# Patient Record
Sex: Female | Born: 1980 | Race: Black or African American | Hispanic: No | Marital: Single | Smoking: Never smoker
Health system: Southern US, Community
[De-identification: ages and names within clinical notes are randomized; demographics above are authoritative.]

## PROBLEM LIST (undated history)

## (undated) DIAGNOSIS — G43909 Migraine, unspecified, not intractable, without status migrainosus: Secondary | ICD-10-CM

## (undated) DIAGNOSIS — J939 Pneumothorax, unspecified: Secondary | ICD-10-CM

## (undated) DIAGNOSIS — N83201 Unspecified ovarian cyst, right side: Secondary | ICD-10-CM

## (undated) HISTORY — PX: OVARIAN CYST SURGERY: SHX726

---

## 2009-04-15 ENCOUNTER — Ambulatory Visit: Payer: Self-pay | Admitting: Diagnostic Radiology

## 2009-04-15 ENCOUNTER — Emergency Department (HOSPITAL_BASED_OUTPATIENT_CLINIC_OR_DEPARTMENT_OTHER): Admission: EM | Admit: 2009-04-15 | Discharge: 2009-04-15 | Payer: Self-pay | Admitting: Emergency Medicine

## 2009-11-11 ENCOUNTER — Ambulatory Visit: Payer: Self-pay | Admitting: Diagnostic Radiology

## 2009-11-11 ENCOUNTER — Emergency Department (HOSPITAL_BASED_OUTPATIENT_CLINIC_OR_DEPARTMENT_OTHER): Admission: EM | Admit: 2009-11-11 | Discharge: 2009-11-11 | Payer: Self-pay | Admitting: Emergency Medicine

## 2010-04-08 ENCOUNTER — Emergency Department (HOSPITAL_COMMUNITY)
Admission: EM | Admit: 2010-04-08 | Discharge: 2010-04-09 | Payer: Self-pay | Source: Home / Self Care | Admitting: Emergency Medicine

## 2010-04-09 ENCOUNTER — Inpatient Hospital Stay (HOSPITAL_COMMUNITY)
Admission: AD | Admit: 2010-04-09 | Discharge: 2010-04-09 | Payer: Self-pay | Attending: Obstetrics and Gynecology | Admitting: Obstetrics and Gynecology

## 2010-04-13 LAB — CBC
HCT: 35.7 % — ABNORMAL LOW (ref 36.0–46.0)
Hemoglobin: 11.9 g/dL — ABNORMAL LOW (ref 12.0–15.0)
MCH: 28 pg (ref 26.0–34.0)
MCH: 28.1 pg (ref 26.0–34.0)
MCHC: 32.6 g/dL (ref 30.0–36.0)
MCHC: 33.3 g/dL (ref 30.0–36.0)
Platelets: 290 10*3/uL (ref 150–400)
RBC: 4.36 MIL/uL (ref 3.87–5.11)
WBC: 12 10*3/uL — ABNORMAL HIGH (ref 4.0–10.5)

## 2010-04-13 LAB — URINE MICROSCOPIC-ADD ON

## 2010-04-13 LAB — COMPREHENSIVE METABOLIC PANEL
ALT: 8 U/L (ref 0–35)
Alkaline Phosphatase: 51 U/L (ref 39–117)
CO2: 23 mEq/L (ref 19–32)
GFR calc Af Amer: >60 mL/min (ref >60)
GFR calc non Af Amer: >60 mL/min (ref >60)
Glucose, Bld: 86 mg/dL (ref 70–99)
Potassium: 4.3 mEq/L (ref 3.5–5.1)
Sodium: 137 mEq/L (ref 135–145)
Total Protein: 7.4 g/dL (ref 6.0–8.3)

## 2010-04-13 LAB — DIFFERENTIAL
Basophils Absolute: 0 10*3/uL (ref 0.0–0.1)
Basophils Absolute: 0 10*3/uL (ref 0.0–0.1)
Basophils Relative: 0 % (ref 0–1)
Eosinophils Relative: 1 % (ref 0–5)
Lymphocytes Relative: 13 % (ref 12–46)
Monocytes Absolute: 1.2 10*3/uL — ABNORMAL HIGH (ref 0.1–1.0)
Neutro Abs: 12.1 10*3/uL — ABNORMAL HIGH (ref 1.7–7.7)
Neutro Abs: 9.4 10*3/uL — ABNORMAL HIGH (ref 1.7–7.7)
Neutrophils Relative %: 83 % — ABNORMAL HIGH (ref 43–77)
Neutrophils Relative %: 78 % — ABNORMAL HIGH (ref 43–77)

## 2010-04-13 LAB — URINALYSIS, ROUTINE W REFLEX MICROSCOPIC
Bilirubin Urine: NEGATIVE
Protein, ur: NEGATIVE mg/dL
Urobilinogen, UA: 0.2 mg/dL (ref 0.0–1.0)

## 2010-04-13 LAB — WET PREP, GENITAL
Trich, Wet Prep: NONE SEEN
Yeast Wet Prep HPF POC: NONE SEEN

## 2010-04-13 LAB — GC/CHLAMYDIA PROBE AMP, GENITAL: Chlamydia, DNA Probe: NEGATIVE

## 2010-04-13 LAB — POCT PREGNANCY, URINE: Preg Test, Ur: NEGATIVE

## 2011-04-07 ENCOUNTER — Inpatient Hospital Stay (HOSPITAL_COMMUNITY)
Admission: AD | Admit: 2011-04-07 | Discharge: 2011-04-07 | Disposition: A | Discharge status: Home / Self Care | Payer: Self-pay | Source: Ambulatory Visit | Attending: Obstetrics & Gynecology | Admitting: Obstetrics & Gynecology

## 2011-04-07 ENCOUNTER — Inpatient Hospital Stay (HOSPITAL_COMMUNITY): Payer: Self-pay

## 2011-04-07 ENCOUNTER — Encounter (HOSPITAL_COMMUNITY): Payer: Self-pay

## 2011-04-07 DIAGNOSIS — A499 Bacterial infection, unspecified: Secondary | ICD-10-CM | POA: Insufficient documentation

## 2011-04-07 DIAGNOSIS — N83209 Unspecified ovarian cyst, unspecified side: Secondary | ICD-10-CM | POA: Insufficient documentation

## 2011-04-07 DIAGNOSIS — N760 Acute vaginitis: Secondary | ICD-10-CM | POA: Insufficient documentation

## 2011-04-07 DIAGNOSIS — N83201 Unspecified ovarian cyst, right side: Secondary | ICD-10-CM

## 2011-04-07 DIAGNOSIS — R109 Unspecified abdominal pain: Secondary | ICD-10-CM | POA: Insufficient documentation

## 2011-04-07 DIAGNOSIS — B9689 Other specified bacterial agents as the cause of diseases classified elsewhere: Secondary | ICD-10-CM | POA: Insufficient documentation

## 2011-04-07 HISTORY — DX: Migraine, unspecified, not intractable, without status migrainosus: G43.909

## 2011-04-07 HISTORY — DX: Unspecified ovarian cyst, right side: N83.201

## 2011-04-07 LAB — URINALYSIS, ROUTINE W REFLEX MICROSCOPIC
Glucose, UA: NEGATIVE mg/dL
Specific Gravity, Urine: 1.02 (ref 1.005–1.030)
pH: 7 (ref 5.0–8.0)

## 2011-04-07 LAB — CBC
HCT: 34.5 % — ABNORMAL LOW (ref 36.0–46.0)
Hemoglobin: 11.4 g/dL — ABNORMAL LOW (ref 12.0–15.0)
MCV: 86 fL (ref 78.0–100.0)
RBC: 4.01 MIL/uL (ref 3.87–5.11)
RDW: 13.1 % (ref 11.5–15.5)
WBC: 9.4 10*3/uL (ref 4.0–10.5)

## 2011-04-07 LAB — URINE MICROSCOPIC-ADD ON

## 2011-04-07 MED ORDER — NAPROXEN SODIUM 550 MG PO TABS: 550.0000 mg | ORAL_TABLET | Freq: Two times a day (BID) | ORAL | Status: DC

## 2011-04-07 MED ORDER — HYDROMORPHONE HCL PF 1 MG/ML IJ SOLN
1.0000 mg | Freq: Once | INTRAMUSCULAR | Status: AC
  Administered 2011-04-07: 1 mg via INTRAMUSCULAR
  Filled 2011-04-07: qty 1

## 2011-04-07 MED ORDER — KETOROLAC TROMETHAMINE 30 MG/ML IJ SOLN
30.0000 mg | Freq: Once | INTRAMUSCULAR | Status: AC
  Administered 2011-04-07: 30 mg via INTRAMUSCULAR

## 2011-04-07 MED ORDER — KETOROLAC TROMETHAMINE 30 MG/ML IJ SOLN
30.0000 mg | Freq: Once | INTRAMUSCULAR | Status: DC
  Filled 2011-04-07: qty 1

## 2011-04-07 MED ORDER — METRONIDAZOLE 500 MG PO TABS: 500.0000 mg | ORAL_TABLET | Freq: Two times a day (BID) | ORAL | Status: AC

## 2011-04-07 NOTE — Progress Notes (Signed)
Patient states she started having right lower abdominal pain that radiates to the left side and up to mid abdomen about one week ago. Periods of nausea with the pain but no vomiting. Patient has a history of ovarian cyst with surgical removal x 2. No bleeding or vaginal discharge.

## 2011-04-07 NOTE — ED Provider Notes (Signed)
History     Chief Complaint  Patient presents with  . Abdominal Pain   HPI Tanya Holland 31 y.o. LMP 03-29-11.  Having lower abdominal pain since Friday.  Same as previously when she had an ovarian cyst.  Has not taken any pain medication at home for this pain.  Has not seen her doctor since June 2012.  Has GYN in [facility] but wants to change to [facility] when her insurance is active.  History of previous surgery for ovarian cyst.   OB History    Grav Para Term Preterm Abortions TAB SAB Ect Mult Living   0 0 0 0 0 0 0 0 0 0       Past Medical History  Diagnosis Date  . Asthma   . Migraines   . Ovarian cyst, right     2 surgeries    Past Surgical History  Procedure Date  . Ovarian cyst surgery 2006 & 2009    Family History  Problem Relation Age of Onset  . Anesthesia problems Neg Hx   . Hypotension Neg Hx   . Malignant hyperthermia Neg Hx   . Pseudochol deficiency Neg Hx     History  Substance Use Topics  . Smoking status: Never Smoker   . Smokeless tobacco: Not on file  . Alcohol Use: Yes    Allergies:  Allergies  Allergen Reactions  . Chocolate Hives, Itching and Swelling    Tongue swells  . Morphine And Related Rash    Hallucinations    Prescriptions prior to admission  Medication Sig Dispense Refill  . albuterol (PROVENTIL HFA;VENTOLIN HFA) 108 (90 BASE) MCG/ACT inhaler Inhale 2 puffs into the lungs every 6 (six) hours as needed. For athsma        Review of Systems  Gastrointestinal: Positive for abdominal pain. Negative for nausea and vomiting.   Physical Exam   Blood pressure 132/82, pulse 69, temperature 98.7 F (37.1 C), temperature source Oral, resp. rate 16, height 4\' 10"  (1.473 m), weight 116 lb (52.617 kg), last menstrual period 03/29/2011, SpO2 99.00%.  Physical Exam  Nursing note and vitals reviewed. Constitutional: She is oriented to person, place, and time. She appears well-developed and well-nourished.  HENT:  Head:  Normocephalic.  Eyes: EOM are normal.  Neck: Neck supple.  GI: Soft. There is tenderness. There is no rebound and no guarding.  Genitourinary:       Speculum exam: Vulva:  White liquid discharge dripping down perineum Vagina - Mod amount of white liquid discharge, no odor Cervix - No contact bleeding Bimanual exam: Cervix closed Uterus exam limited due to pain Adnexa exam limited due to pain, GC/Chlam, wet prep done Chaperone present for exam.  Musculoskeletal: Normal range of motion.  Neurological: She is alert and oriented to person, place, and time.  Skin: Skin is warm and dry.  Psychiatric: She has a normal mood and affect.    MAU Course  Procedures  Clinical Data: Pelvic pain. History of ovarian cysts.  TRANSABDOMINAL AND TRANSVAGINAL ULTRASOUND OF PELVIS  Technique: Both transabdominal and transvaginal ultrasound  examinations of the pelvis were performed. Transabdominal technique  was performed for global imaging of the pelvis including uterus,  ovaries, adnexal regions, and pelvic cul-de-sac.  Comparison: CT of the abdomen and pelvis performed 04/08/2010, and  pelvic ultrasound performed 04/09/2010  It was necessary to proceed with endovaginal exam following the  transabdominal exam to visualize the uterus and ovaries in greater  detail.  Findings:  Uterus: Normal  in size and appearance; measures 6.8 x 2.6 x 3.1 cm.  A few Nabothian cysts are noted at the cervix.  Endometrium: Normal in thickness and appearance; measures 0.4 cm in  thickness.  Right ovary: Measures 4.3 x 2.9 x 3.2 cm. There is a stable  appearance to a 2.9 cm cyst at the right ovary. This may contain a  small amount of debris or a small 1.1 cm soft tissue nodule  posteriorly, new from the prior study. There is normal color  Doppler blood flow to the right ovary; no definite blood flow is  characterized within the apparent soft tissue nodule.  Left ovary: Normal appearance/no adnexal mass;  measures 3.2 x 1.9 x  2.6 cm in size.  Other findings: Trace free fluid seen within the pelvic cul-de-sac.  IMPRESSION:  1. No evidence of ovarian torsion.  2. Stable appearance to 2.9 cm cyst at the right ovary. This may  contain a small amount of debris or a small 1.1 cm soft tissue  nodule posteriorly, apparently new from the prior study. Recommend  follow-up pelvic ultrasound in 2-3 months to ensure stability.    MDM Results for orders placed during the hospital encounter of 04/07/11 (from the past 24 hour(s))  URINALYSIS, ROUTINE W REFLEX MICROSCOPIC     Status: Abnormal   Collection Time   04/07/11  7:05 PM      Component Value Range   Color, Urine YELLOW  YELLOW    APPearance HAZY (*) CLEAR    Specific Gravity, Urine 1.020  1.005 - 1.030    pH 7.0  5.0 - 8.0    Glucose, UA NEGATIVE  NEGATIVE (mg/dL)   Hgb urine dipstick TRACE (*) NEGATIVE    Bilirubin Urine NEGATIVE  NEGATIVE    Ketones, ur NEGATIVE  NEGATIVE (mg/dL)   Protein, ur NEGATIVE  NEGATIVE (mg/dL)   Urobilinogen, UA 0.2  0.0 - 1.0 (mg/dL)   Nitrite NEGATIVE  NEGATIVE    Leukocytes, UA SMALL (*) NEGATIVE   URINE MICROSCOPIC-ADD ON     Status: Abnormal   Collection Time   04/07/11  7:05 PM      Component Value Range   Squamous Epithelial / LPF MANY (*) RARE    WBC, UA 3-6  <3 (WBC/hpf)   Bacteria, UA FEW (*) RARE    Urine-Other MUCOUS PRESENT    POCT PREGNANCY, URINE     Status: Normal   Collection Time   04/07/11  7:15 PM      Component Value Range   Preg Test, Ur NEGATIVE    WET PREP, GENITAL     Status: Abnormal   Collection Time   04/07/11  7:56 PM      Component Value Range   Yeast, Wet Prep NONE SEEN  NONE SEEN    Trich, Wet Prep NONE SEEN  NONE SEEN    Clue Cells, Wet Prep MODERATE (*) NONE SEEN    WBC, Wet Prep HPF POC MANY (*) NONE SEEN   CBC     Status: Abnormal   Collection Time   04/07/11  8:07 PM      Component Value Range   WBC 9.4  4.0 - 10.5 (K/uL)   RBC 4.01  3.87 - 5.11 (MIL/uL)    Hemoglobin 11.4 (*) 12.0 - 15.0 (g/dL)   HCT 16.1 (*) 09.6 - 46.0 (%)   MCV 86.0  78.0 - 100.0 (fL)   MCH 28.4  26.0 - 34.0 (pg)   MCHC 33.0  30.0 - 36.0 (g/dL)   RDW 62.9  52.8 - 41.3 (%)   Platelets 290  150 - 400 (K/uL)   Toradol 30 mg IM for pain but no relief obtained.  Will given dilaudid 1 mg IM (client states she has received it before with an ovarian cyst).  Assessment and Plan  Small right ovarian cyst Bacterial vaginosis  Plan rx ultram for pain rx metronidazole 500 mg PO bid x 7 days for BV Will send message to GYN clinic to be seen for follow up    [facility] 04/07/2011, 8:02 PM   Nolene Bernheim, NP 04/07/11 2126  Nolene Bernheim, NP 04/07/11 2134

## 2011-04-08 LAB — GC/CHLAMYDIA PROBE AMP, GENITAL: GC Probe Amp, Genital: NEGATIVE

## 2011-05-03 ENCOUNTER — Encounter: Payer: Self-pay | Admitting: Advanced Practice Midwife

## 2011-05-16 ENCOUNTER — Emergency Department (HOSPITAL_BASED_OUTPATIENT_CLINIC_OR_DEPARTMENT_OTHER)
Admission: EM | Admit: 2011-05-16 | Discharge: 2011-05-16 | Disposition: A | Discharge status: Home / Self Care | Payer: BC Managed Care – PPO | Attending: Emergency Medicine | Admitting: Emergency Medicine

## 2011-05-16 ENCOUNTER — Encounter (HOSPITAL_BASED_OUTPATIENT_CLINIC_OR_DEPARTMENT_OTHER): Payer: Self-pay | Admitting: Emergency Medicine

## 2011-05-16 DIAGNOSIS — R55 Syncope and collapse: Secondary | ICD-10-CM | POA: Insufficient documentation

## 2011-05-16 DIAGNOSIS — N390 Urinary tract infection, site not specified: Secondary | ICD-10-CM

## 2011-05-16 DIAGNOSIS — R112 Nausea with vomiting, unspecified: Secondary | ICD-10-CM | POA: Insufficient documentation

## 2011-05-16 DIAGNOSIS — J45909 Unspecified asthma, uncomplicated: Secondary | ICD-10-CM | POA: Insufficient documentation

## 2011-05-16 DIAGNOSIS — R51 Headache: Secondary | ICD-10-CM

## 2011-05-16 LAB — URINE MICROSCOPIC-ADD ON

## 2011-05-16 LAB — URINALYSIS, ROUTINE W REFLEX MICROSCOPIC
Bilirubin Urine: NEGATIVE
Hgb urine dipstick: NEGATIVE
Specific Gravity, Urine: 1.014 (ref 1.005–1.030)
pH: 6.5 (ref 5.0–8.0)

## 2011-05-16 MED ORDER — DIPHENHYDRAMINE HCL 50 MG/ML IJ SOLN
25.0000 mg | Freq: Once | INTRAMUSCULAR | Status: DC
  Filled 2011-05-16: qty 1

## 2011-05-16 MED ORDER — METOCLOPRAMIDE HCL 5 MG/ML IJ SOLN
10.0000 mg | Freq: Once | INTRAMUSCULAR | Status: AC
  Administered 2011-05-16: 10 mg via INTRAVENOUS

## 2011-05-16 MED ORDER — METOCLOPRAMIDE HCL 5 MG/ML IJ SOLN
10.0000 mg | Freq: Once | INTRAMUSCULAR | Status: DC
  Filled 2011-05-16: qty 2

## 2011-05-16 MED ORDER — DIPHENHYDRAMINE HCL 50 MG/ML IJ SOLN
25.0000 mg | Freq: Once | INTRAMUSCULAR | Status: AC
  Administered 2011-05-16: 25 mg via INTRAVENOUS

## 2011-05-16 MED ORDER — KETOROLAC TROMETHAMINE 30 MG/ML IJ SOLN
30.0000 mg | Freq: Once | INTRAMUSCULAR | Status: AC
  Administered 2011-05-16: 30 mg via INTRAVENOUS

## 2011-05-16 MED ORDER — SODIUM CHLORIDE 0.9 % IV BOLUS (SEPSIS)
1000.0000 mL | Freq: Once | INTRAVENOUS | Status: AC
  Administered 2011-05-16: 1000 mL via INTRAVENOUS

## 2011-05-16 MED ORDER — KETOROLAC TROMETHAMINE 30 MG/ML IJ SOLN
60.0000 mg | Freq: Once | INTRAMUSCULAR | Status: DC
  Filled 2011-05-16: qty 2

## 2011-05-16 MED ORDER — SULFAMETHOXAZOLE-TRIMETHOPRIM 800-160 MG PO TABS: 1.0000 | ORAL_TABLET | Freq: Two times a day (BID) | ORAL | Status: AC

## 2011-05-16 NOTE — Discharge Instructions (Signed)
Headaches, Frequently Asked Questions MIGRAINE HEADACHES Q: What is migraine? What causes it? How can I treat it? A: Generally, migraine headaches begin as a dull ache. Then they develop into a constant, throbbing, and pulsating pain. You may experience pain at the temples. You may experience pain at the front or back of one or both sides of the head. The pain is usually accompanied by a combination of:  Nausea.   Vomiting.   Sensitivity to light and noise.  Some people (about 15%) experience an aura (see below) before an attack. The cause of migraine is believed to be chemical reactions in the brain. Treatment for migraine may include over-the-counter or prescription medications. It may also include self-help techniques. These include relaxation training and biofeedback.  Q: What is an aura? A: About 15% of people with migraine get an "aura". This is a sign of neurological symptoms that occur before a migraine headache. You may see wavy or jagged lines, dots, or flashing lights. You might experience tunnel vision or blind spots in one or both eyes. The aura can include visual or auditory hallucinations (something imagined). It may include disruptions in smell (such as strange odors), taste or touch. Other symptoms include:  Numbness.   A "pins and needles" sensation.   Difficulty in recalling or speaking the correct word.  These neurological events may last as long as 60 minutes. These symptoms will fade as the headache begins. Q: What is a trigger? A: Certain physical or environmental factors can lead to or "trigger" a migraine. These include:  Foods.   Hormonal changes.   Weather.   Stress.  It is important to remember that triggers are different for everyone. To help prevent migraine attacks, you need to figure out which triggers affect you. Keep a headache diary. This is a good way to track triggers. The diary will help you talk to your healthcare professional about your  condition. Q: Does weather affect migraines? A: Bright sunshine, hot, humid conditions, and drastic changes in barometric pressure may lead to, or "trigger," a migraine attack in some people. But studies have shown that weather does not act as a trigger for everyone with migraines. Q: What is the link between migraine and hormones? A: Hormones start and regulate many of your body's functions. Hormones keep your body in balance within a constantly changing environment. The levels of hormones in your body are unbalanced at times. Examples are during menstruation, pregnancy, or menopause. That can lead to a migraine attack. In fact, about three quarters of all women with migraine report that their attacks are related to the menstrual cycle.  Q: Is there an increased risk of stroke for migraine sufferers? A: The likelihood of a migraine attack causing a stroke is very remote. That is not to say that migraine sufferers cannot have a stroke associated with their migraines. In persons under age 40, the most common associated factor for stroke is migraine headache. But over the course of a person's normal life span, the occurrence of migraine headache may actually be associated with a reduced risk of dying from cerebrovascular disease due to stroke.  Q: What are acute medications for migraine? A: Acute medications are used to treat the pain of the headache after it has started. Examples over-the-counter medications, NSAIDs, ergots, and triptans.  Q: What are the triptans? A: Triptans are the newest class of abortive medications. They are specifically targeted to treat migraine. Triptans are vasoconstrictors. They moderate some chemical reactions in the brain.   The triptans work on receptors in your brain. Triptans help to restore the balance of a neurotransmitter called serotonin. Fluctuations in levels of serotonin are thought to be a main cause of migraine.  Q: Are over-the-counter medications for migraine  effective? A: Over-the-counter, or "OTC," medications may be effective in relieving mild to moderate pain and associated symptoms of migraine. But you should see your caregiver before beginning any treatment regimen for migraine.  Q: What are preventive medications for migraine? A: Preventive medications for migraine are sometimes referred to as "prophylactic" treatments. They are used to reduce the frequency, severity, and length of migraine attacks. Examples of preventive medications include antiepileptic medications, antidepressants, beta-blockers, calcium channel blockers, and NSAIDs (nonsteroidal anti-inflammatory drugs). Q: Why are anticonvulsants used to treat migraine? A: During the past few years, there has been an increased interest in antiepileptic drugs for the prevention of migraine. They are sometimes referred to as "anticonvulsants". Both epilepsy and migraine may be caused by similar reactions in the brain.  Q: Why are antidepressants used to treat migraine? A: Antidepressants are typically used to treat people with depression. They may reduce migraine frequency by regulating chemical levels, such as serotonin, in the brain.  Q: What alternative therapies are used to treat migraine? A: The term "alternative therapies" is often used to describe treatments considered outside the scope of conventional Western medicine. Examples of alternative therapy include acupuncture, acupressure, and yoga. Another common alternative treatment is herbal therapy. Some herbs are believed to relieve headache pain. Always discuss alternative therapies with your caregiver before proceeding. Some herbal products contain arsenic and other toxins. TENSION HEADACHES Q: What is a tension-type headache? What causes it? How can I treat it? A: Tension-type headaches occur randomly. They are often the result of temporary stress, anxiety, fatigue, or anger. Symptoms include soreness in your temples, a tightening  band-like sensation around your head (a "vice-like" ache). Symptoms can also include a pulling feeling, pressure sensations, and contracting head and neck muscles. The headache begins in your forehead, temples, or the back of your head and neck. Treatment for tension-type headache may include over-the-counter or prescription medications. Treatment may also include self-help techniques such as relaxation training and biofeedback. CLUSTER HEADACHES Q: What is a cluster headache? What causes it? How can I treat it? A: Cluster headache gets its name because the attacks come in groups. The pain arrives with little, if any, warning. It is usually on one side of the head. A tearing or bloodshot eye and a runny nose on the same side of the headache may also accompany the pain. Cluster headaches are believed to be caused by chemical reactions in the brain. They have been described as the most severe and intense of any headache type. Treatment for cluster headache includes prescription medication and oxygen. SINUS HEADACHES Q: What is a sinus headache? What causes it? How can I treat it? A: When a cavity in the bones of the face and skull (a sinus) becomes inflamed, the inflammation will cause localized pain. This condition is usually the result of an allergic reaction, a tumor, or an infection. If your headache is caused by a sinus blockage, such as an infection, you will probably have a fever. An x-ray will confirm a sinus blockage. Your caregiver's treatment might include antibiotics for the infection, as well as antihistamines or decongestants.  REBOUND HEADACHES Q: What is a rebound headache? What causes it? How can I treat it? A: A pattern of taking acute headache medications too   often can lead to a condition known as "rebound headache." A pattern of taking too much headache medication includes taking it more than 2 days per week or in excessive amounts. That means more than the label or a caregiver advises.  With rebound headaches, your medications not only stop relieving pain, they actually begin to cause headaches. Doctors treat rebound headache by tapering the medication that is being overused. Sometimes your caregiver will gradually substitute a different type of treatment or medication. Stopping may be a challenge. Regularly overusing a medication increases the potential for serious side effects. Consult a caregiver if you regularly use headache medications more than 2 days per week or more than the label advises. ADDITIONAL QUESTIONS AND ANSWERS Q: What is biofeedback? A: Biofeedback is a self-help treatment. Biofeedback uses special equipment to monitor your body's involuntary physical responses. Biofeedback monitors:  Breathing.   Pulse.   Heart rate.   Temperature.   Muscle tension.   Brain activity.  Biofeedback helps you refine and perfect your relaxation exercises. You learn to control the physical responses that are related to stress. Once the technique has been mastered, you do not need the equipment any more. Q: Are headaches hereditary? A: Four out of five (80%) of people that suffer report a family history of migraine. Scientists are not sure if this is genetic or a family predisposition. Despite the uncertainty, a child has a 50% chance of having migraine if one parent suffers. The child has a 75% chance if both parents suffer.  Q: Can children get headaches? A: By the time they reach high school, most young people have experienced some type of headache. Many safe and effective approaches or medications can prevent a headache from occurring or stop it after it has begun.  Q: What type of doctor should I see to diagnose and treat my headache? A: Start with your primary caregiver. Discuss his or her experience and approach to headaches. Discuss methods of classification, diagnosis, and treatment. Your caregiver may decide to recommend you to a headache specialist, depending upon  your symptoms or other physical conditions. Having diabetes, allergies, etc., may require a more comprehensive and inclusive approach to your headache. The National Headache Foundation will provide, upon request, a list of [facility] physician members in your state. Document Released: 05/29/2003 Document Revised: 11/18/2010 Document Reviewed: 11/06/2007 [facility] Patient Information 2012 [place], [place].Urinary Tract Infection Infections of the urinary tract can start in several places. A bladder infection (cystitis), a kidney infection (pyelonephritis), and a prostate infection (prostatitis) are different types of urinary tract infections (UTIs). They usually get better if treated with medicines (antibiotics) that kill germs. Take all the medicine until it is gone. You or your child may feel better in a few days, but TAKE ALL MEDICINE or the infection may not respond and may become more difficult to treat. HOME CARE INSTRUCTIONS   Drink enough water and fluids to keep the urine clear or pale yellow. Cranberry juice is especially recommended, in addition to large amounts of water.   Avoid caffeine, tea, and carbonated beverages. They tend to irritate the bladder.   Alcohol may irritate the prostate.   Only take over-the-counter or prescription medicines for pain, discomfort, or fever as directed by your caregiver.  To prevent further infections:  Empty the bladder often. Avoid holding urine for long periods of time.   After a bowel movement, women should cleanse from front to back. Use each tissue only once.   Empty the bladder before  and after sexual intercourse.  FINDING OUT THE RESULTS OF YOUR TEST Not all test results are available during your visit. If your or your child's test results are not back during the visit, make an appointment with your caregiver to find out the results. Do not assume everything is normal if you have not heard from your caregiver or the medical facility. It is important  for you to follow up on all test results. SEEK MEDICAL CARE IF:   There is back pain.   Your baby is older than 3 months with a rectal temperature of 100.5 F (38.1 C) or higher for more than 1 day.   Your or your child's problems (symptoms) are no better in 3 days. Return sooner if you or your child is getting worse.  SEEK IMMEDIATE MEDICAL CARE IF:   There is severe back pain or lower abdominal pain.   You or your child develops chills.   You have a fever.   Your baby is older than 3 months with a rectal temperature of 102 F (38.9 C) or higher.   Your baby is 80 months old or younger with a rectal temperature of 100.4 F (38 C) or higher.   There is nausea or vomiting.   There is continued burning or discomfort with urination.  MAKE SURE YOU:   Understand these instructions.   Will watch your condition.   Will get help right away if you are not doing well or get worse.  Document Released: 12/16/2004 Document Revised: 11/18/2010 Document Reviewed: 07/21/2006 [facility] Patient Information 2012 [place], [place].

## 2011-05-16 NOTE — ED Notes (Signed)
Pt feels better, wants to go home, FNP notified

## 2011-05-16 NOTE — ED Provider Notes (Signed)
History     CSN: [id number]  Arrival date & time 05/16/11  1318   First MD Initiated Contact with Patient 05/16/11 1442      Chief Complaint  Patient presents with  . Headache    (Consider location/radiation/quality/duration/timing/severity/associated sxs/prior treatment) HPI Comments: Pt c/o headache since yesterday which is similar to previous migraines  Patient is a 31 y.o. female presenting with headaches. The history is provided by the patient. No language interpreter was used.  Headache  This is a recurrent problem. The current episode started yesterday. The problem occurs constantly. The headache is associated with bright light. The pain is located in the frontal region. The quality of the pain is described as throbbing. The pain is moderate. The pain does not radiate. Associated symptoms include syncope, nausea and vomiting. Pertinent negatives include no shortness of breath. She has tried NSAIDs and aspirin for the symptoms. The treatment provided no relief.    Past Medical History  Diagnosis Date  . Asthma   . Migraines   . Ovarian cyst, right     2 surgeries    Past Surgical History  Procedure Date  . Ovarian cyst surgery 2006 & 2009    Family History  Problem Relation Age of Onset  . Anesthesia problems Neg Hx   . Hypotension Neg Hx   . Malignant hyperthermia Neg Hx   . Pseudochol deficiency Neg Hx     History  Substance Use Topics  . Smoking status: Never Smoker   . Smokeless tobacco: Not on file  . Alcohol Use: Yes    OB History    Grav Para Term Preterm Abortions TAB SAB Ect Mult Living   0 0 0 0 0 0 0 0 0 0       Review of Systems  Respiratory: Negative for shortness of breath.   Cardiovascular: Positive for syncope.  Gastrointestinal: Positive for nausea and vomiting.  Neurological: Positive for headaches.  All other systems reviewed and are negative.    Allergies  Chocolate and Morphine and related  Home Medications   Current  Outpatient Rx  Name Route Sig Dispense Refill  . ASPIRIN 325 MG PO TABS Oral Take 650 mg by mouth every 6 (six) hours as needed.    . ASPIRIN-ACETAMINOPHEN-CAFFEINE 250-250-65 MG PO TABS Oral Take 2 tablets by mouth every 6 (six) hours as needed.    . ALBUTEROL SULFATE HFA 108 (90 BASE) MCG/ACT IN AERS Inhalation Inhale 2 puffs into the lungs every 6 (six) hours as needed. For athsma    . NAPROXEN SODIUM 550 MG PO TABS Oral Take 1 tablet (550 mg total) by mouth 2 (two) times daily with a meal. 20 tablet 0    BP 118/84  Pulse 71  Temp(Src) 98 F (36.7 C) (Oral)  Resp 16  SpO2 97%  LMP 04/29/2011  Physical Exam  Nursing note and vitals reviewed. Constitutional: She is oriented to person, place, and time. She appears well-developed and well-nourished.  HENT:  Head: Normocephalic and atraumatic.  Right Ear: External ear normal.  Left Ear: External ear normal.  Eyes: Conjunctivae and EOM are normal.  Neck: Neck supple.  Cardiovascular: Normal rate and regular rhythm.   Pulmonary/Chest: Effort normal and breath sounds normal.  Abdominal: Soft. Bowel sounds are normal.  Musculoskeletal: Normal range of motion.  Neurological: She is alert and oriented to person, place, and time.  Skin: Skin is warm and dry.  Psychiatric: She has a normal mood and affect.    ED  Course  Procedures (including critical care time)  Labs Reviewed  URINALYSIS, ROUTINE W REFLEX MICROSCOPIC - Abnormal; Notable for the following:    APPearance CLOUDY (*)    Leukocytes, UA SMALL (*)    All other components within normal limits  URINE MICROSCOPIC-ADD ON - Abnormal; Notable for the following:    Squamous Epithelial / LPF FEW (*)    Bacteria, UA MANY (*)    All other components within normal limits  PREGNANCY, URINE   No results found.   1. UTI (lower urinary tract infection)   2. Headache       MDM  Pt is feeling better at this time, similar to previous headaches:will treat for simple  uti        Teressa Lower, NP 05/16/11 1624  Teressa Lower, NP 05/16/11 1626

## 2011-05-16 NOTE — ED Notes (Signed)
Pt c/o migraine since 7pm yesterday; has taken Excedrin & Bayer asa w/o relief

## 2011-05-17 NOTE — ED Provider Notes (Signed)
Medical screening examination/treatment/procedure(s) were performed by non-physician practitioner and as supervising physician I was immediately available for consultation/collaboration.  Ethelda Chick, MD 05/17/11 1124

## 2011-05-22 ENCOUNTER — Emergency Department (INDEPENDENT_AMBULATORY_CARE_PROVIDER_SITE_OTHER): Payer: BC Managed Care – PPO

## 2011-05-22 ENCOUNTER — Other Ambulatory Visit: Payer: Self-pay

## 2011-05-22 ENCOUNTER — Encounter (HOSPITAL_BASED_OUTPATIENT_CLINIC_OR_DEPARTMENT_OTHER): Payer: Self-pay | Admitting: Emergency Medicine

## 2011-05-22 ENCOUNTER — Emergency Department (HOSPITAL_BASED_OUTPATIENT_CLINIC_OR_DEPARTMENT_OTHER)
Admission: EM | Admit: 2011-05-22 | Discharge: 2011-05-22 | Disposition: A | Discharge status: Home Health | Payer: BC Managed Care – PPO | Attending: Emergency Medicine | Admitting: Emergency Medicine

## 2011-05-22 DIAGNOSIS — J45901 Unspecified asthma with (acute) exacerbation: Secondary | ICD-10-CM

## 2011-05-22 DIAGNOSIS — J3489 Other specified disorders of nose and nasal sinuses: Secondary | ICD-10-CM

## 2011-05-22 DIAGNOSIS — R079 Chest pain, unspecified: Secondary | ICD-10-CM | POA: Insufficient documentation

## 2011-05-22 DIAGNOSIS — R059 Cough, unspecified: Secondary | ICD-10-CM | POA: Insufficient documentation

## 2011-05-22 DIAGNOSIS — R05 Cough: Secondary | ICD-10-CM | POA: Insufficient documentation

## 2011-05-22 MED ORDER — IBUPROFEN 800 MG PO TABS
800.0000 mg | ORAL_TABLET | Freq: Once | ORAL | Status: AC
  Administered 2011-05-22: 800 mg via ORAL
  Filled 2011-05-22: qty 1

## 2011-05-22 MED ORDER — ALBUTEROL SULFATE (5 MG/ML) 0.5% IN NEBU
5.0000 mg | INHALATION_SOLUTION | Freq: Once | RESPIRATORY_TRACT | Status: AC
  Administered 2011-05-22: 5 mg via RESPIRATORY_TRACT
  Filled 2011-05-22: qty 1

## 2011-05-22 MED ORDER — PREDNISONE 10 MG PO TABS
60.0000 mg | ORAL_TABLET | Freq: Once | ORAL | Status: AC
  Administered 2011-05-22: 60 mg via ORAL
  Filled 2011-05-22: qty 1

## 2011-05-22 MED ORDER — ALBUTEROL SULFATE HFA 108 (90 BASE) MCG/ACT IN AERS
2.0000 | INHALATION_SPRAY | RESPIRATORY_TRACT | Status: DC | PRN
  Administered 2011-05-22: 2 via RESPIRATORY_TRACT
  Filled 2011-05-22: qty 6.7

## 2011-05-22 MED ORDER — OXYCODONE-ACETAMINOPHEN 5-325 MG PO TABS
2.0000 | ORAL_TABLET | Freq: Once | ORAL | Status: AC
  Administered 2011-05-22: 2 via ORAL
  Filled 2011-05-22: qty 2

## 2011-05-22 MED ORDER — PREDNISONE 20 MG PO TABS: 60.0000 mg | ORAL_TABLET | Freq: Once | ORAL | Status: AC

## 2011-05-22 MED ORDER — OXYCODONE-ACETAMINOPHEN 5-325 MG PO TABS: 2.0000 | ORAL_TABLET | Freq: Four times a day (QID) | ORAL | Status: AC | PRN

## 2011-05-22 MED ORDER — ONDANSETRON 8 MG PO TBDP
8.0000 mg | ORAL_TABLET | Freq: Once | ORAL | Status: AC
  Administered 2011-05-22: 8 mg via ORAL
  Filled 2011-05-22: qty 1

## 2011-05-22 NOTE — Discharge Instructions (Signed)
Asthma, Adult Asthma is caused by narrowing of the air passages in the lungs. It may be triggered by pollen, dust, animal dander, molds, some foods, respiratory infections, exposure to smoke, exercise, emotional stress or other allergens (things that cause allergic reactions or allergies). Repeat attacks are common. HOME CARE INSTRUCTIONS   Use prescription medications as ordered by your caregiver.   Avoid pollen, dust, animal dander, molds, smoke and other things that cause attacks at home and at work.   You may have fewer attacks if you decrease dust in your home. Electrostatic air cleaners may help.   It may help to replace your pillows or mattress with materials less likely to cause allergies.   Talk to your caregiver about an action plan for managing asthma attacks at home, including, the use of a peak flow meter which measures the severity of your asthma attack. An action plan can help minimize or stop the attack without having to seek medical care.   If you are not on a fluid restriction, drink 8 to 10 glasses of water each day.   Always have a plan prepared for seeking medical attention, including, calling your physician, accessing local emergency care, and calling 911 (in the [place] for a severe attack.   Discuss possible exercise routines with your caregiver.   If animal dander is the cause of asthma, you may need to get rid of pets.  SEEK MEDICAL CARE IF:   You have wheezing and shortness of breath even if taking medicine to prevent attacks.   You have muscle aches, chest pain or thickening of sputum.   Your sputum changes from clear or white to yellow, green, gray, or bloody.   You have any problems that may be related to the medicine you are taking (such as a rash, itching, swelling or trouble breathing).  SEEK IMMEDIATE MEDICAL CARE IF:   Your usual medicines do not stop your wheezing or there is increased coughing and/or shortness of breath.   You have increased  difficulty breathing.   You have a fever.  MAKE SURE YOU:   Understand these instructions.   Will watch your condition.   Will get help right away if you are not doing well or get worse.  Document Released: 03/08/2005 Document Revised: 11/18/2010 Document Reviewed: 10/25/2007 [facility] Patient Information 2012 [place], [place].Chest Pain (Nonspecific) It is often hard to give a specific diagnosis for the cause of chest pain. There is always a chance that your pain could be related to something serious, such as a heart attack or a blood clot in the lungs. You need to follow up with your caregiver for further evaluation. CAUSES   Heartburn.   Pneumonia or bronchitis.   Anxiety and stress.   Inflammation around your heart (pericarditis) or lung (pleuritis or pleurisy).   A blood clot in the lung.   A collapsed lung (pneumothorax). It can develop suddenly on its own (spontaneous pneumothorax) or from injury (trauma) to the chest.  The chest wall is composed of bones, muscles, and cartilage. Any of these can be the source of the pain.  The bones can be bruised by injury.   The muscles or cartilage can be strained by coughing or overwork.   The cartilage can be affected by inflammation and become sore (costochondritis).  DIAGNOSIS  Lab tests or other studies, such as X-rays, an EKG, stress testing, or cardiac imaging, may be needed to find the cause of your pain.  TREATMENT   Treatment depends on  what may be causing your chest pain. Treatment may include:   Acid blockers for heartburn.   Anti-inflammatory medicine.   Pain medicine for inflammatory conditions.   Antibiotics if an infection is present.   You may be advised to change lifestyle habits. This includes stopping smoking and avoiding caffeine and chocolate.   You may be advised to keep your head raised (elevated) when sleeping. This reduces the chance of acid going backward from your stomach into your esophagus.    Most of the time, nonspecific chest pain will improve within 2 to 3 days with rest and mild pain medicine.  HOME CARE INSTRUCTIONS   If antibiotics were prescribed, take the full amount even if you start to feel better.   For the next few days, avoid physical activities that bring on chest pain. Continue physical activities as directed.   Do not smoke cigarettes or drink alcohol until your symptoms are gone.   Only take over-the-counter or prescription medicine for pain, discomfort, or fever as directed by your caregiver.   Follow your caregiver's suggestions for further testing if your chest pain does not go away.   Keep any follow-up appointments you made. If you do not go to an appointment, you could develop lasting (chronic) problems with pain. If there is any problem keeping an appointment, you must call to reschedule.  SEEK MEDICAL CARE IF:   You think you are having problems from the medicine you are taking. Read your medicine instructions carefully.   Your chest pain does not go away, even after treatment.   You develop a rash with blisters on your chest.  SEEK IMMEDIATE MEDICAL CARE IF:   You have increased chest pain or pain that spreads to your arm, neck, jaw, back, or belly (abdomen).   You develop shortness of breath, an increasing cough, or you are coughing up blood.   You have severe back or abdominal pain, feel sick to your stomach (nauseous) or throw up (vomit).   You develop severe weakness, fainting, or chills.   You have an oral temperature above 102 F (38.9 C), not controlled by medicine.  THIS IS AN EMERGENCY. Do not wait to see if the pain will go away. Get medical help at once. Call your local emergency services (911 in [place] Do not drive yourself to the hospital. MAKE SURE YOU:   Understand these instructions.   Will watch your condition.   Will get help right away if you are not doing well or get worse.  Document Released: 12/16/2004 Document  Revised: 11/18/2010 Document Reviewed: 10/12/2007 [facility] Patient Information 2012 [place], [place].Chest Pain (Nonspecific) It is often hard to give a specific diagnosis for the cause of chest pain. There is always a chance that your pain could be related to something serious, such as a heart attack or a blood clot in the lungs. You need to follow up with your caregiver for further evaluation. CAUSES   Heartburn.   Pneumonia or bronchitis.   Anxiety and stress.   Inflammation around your heart (pericarditis) or lung (pleuritis or pleurisy).   A blood clot in the lung.   A collapsed lung (pneumothorax). It can develop suddenly on its own (spontaneous pneumothorax) or from injury (trauma) to the chest.  The chest wall is composed of bones, muscles, and cartilage. Any of these can be the source of the pain.  The bones can be bruised by injury.   The muscles or cartilage can be strained by coughing or overwork.  The cartilage can be affected by inflammation and become sore (costochondritis).  DIAGNOSIS  Lab tests or other studies, such as X-rays, an EKG, stress testing, or cardiac imaging, may be needed to find the cause of your pain.  TREATMENT   Treatment depends on what may be causing your chest pain. Treatment may include:   Acid blockers for heartburn.   Anti-inflammatory medicine.   Pain medicine for inflammatory conditions.   Antibiotics if an infection is present.   You may be advised to change lifestyle habits. This includes stopping smoking and avoiding caffeine and chocolate.   You may be advised to keep your head raised (elevated) when sleeping. This reduces the chance of acid going backward from your stomach into your esophagus.   Most of the time, nonspecific chest pain will improve within 2 to 3 days with rest and mild pain medicine.  HOME CARE INSTRUCTIONS   If antibiotics were prescribed, take the full amount even if you start to feel better.   For the  next few days, avoid physical activities that bring on chest pain. Continue physical activities as directed.   Do not smoke cigarettes or drink alcohol until your symptoms are gone.   Only take over-the-counter or prescription medicine for pain, discomfort, or fever as directed by your caregiver.   Follow your caregiver's suggestions for further testing if your chest pain does not go away.   Keep any follow-up appointments you made. If you do not go to an appointment, you could develop lasting (chronic) problems with pain. If there is any problem keeping an appointment, you must call to reschedule.  SEEK MEDICAL CARE IF:   You think you are having problems from the medicine you are taking. Read your medicine instructions carefully.   Your chest pain does not go away, even after treatment.   You develop a rash with blisters on your chest.  SEEK IMMEDIATE MEDICAL CARE IF:   You have increased chest pain or pain that spreads to your arm, neck, jaw, back, or belly (abdomen).   You develop shortness of breath, an increasing cough, or you are coughing up blood.   You have severe back or abdominal pain, feel sick to your stomach (nauseous) or throw up (vomit).   You develop severe weakness, fainting, or chills.   You have an oral temperature above 102 F (38.9 C), not controlled by medicine.  THIS IS AN EMERGENCY. Do not wait to see if the pain will go away. Get medical help at once. Call your local emergency services (911 in [place] Do not drive yourself to the hospital. MAKE SURE YOU:   Understand these instructions.   Will watch your condition.   Will get help right away if you are not doing well or get worse.  Document Released: 12/16/2004 Document Revised: 11/18/2010 Document Reviewed: 10/12/2007 [employer] Patient Information 2012 [place], Adult Asthma is caused by narrowing of the air passages in the lungs. It may be triggered by pollen, dust, animal dander, molds, some  foods, respiratory infections, exposure to smoke, exercise, emotional stress or other allergens (things that cause allergic reactions or allergies). Repeat attacks are common. HOME CARE INSTRUCTIONS   Use prescription medications as ordered by your caregiver.   Avoid pollen, dust, animal dander, molds, smoke and other things that cause attacks at home and at work.   You may have fewer attacks if you decrease dust in your home. Electrostatic air cleaners may help.   It may help to replace  your pillows or mattress with materials less likely to cause allergies.   Talk to your caregiver about an action plan for managing asthma attacks at home, including, the use of a peak flow meter which measures the severity of your asthma attack. An action plan can help minimize or stop the attack without having to seek medical care.   If you are not on a fluid restriction, drink 8 to 10 glasses of water each day.   Always have a plan prepared for seeking medical attention, including, calling your physician, accessing local emergency care, and calling 911 (in the [place] for a severe attack.   Discuss possible exercise routines with your caregiver.   If animal dander is the cause of asthma, you may need to get rid of pets.  SEEK MEDICAL CARE IF:   You have wheezing and shortness of breath even if taking medicine to prevent attacks.   You have muscle aches, chest pain or thickening of sputum.   Your sputum changes from clear or white to yellow, green, gray, or bloody.   You have any problems that may be related to the medicine you are taking (such as a rash, itching, swelling or trouble breathing).  SEEK IMMEDIATE MEDICAL CARE IF:   Your usual medicines do not stop your wheezing or there is increased coughing and/or shortness of breath.   You have increased difficulty breathing.   You have a fever.  MAKE SURE YOU:   Understand these instructions.   Will watch your condition.   Will get help  right away if you are not doing well or get worse.  Document Released: 03/08/2005 Document Revised: 11/18/2010 Document Reviewed: 10/25/2007 [facility] Patient Information 2012 [employer].

## 2011-05-22 NOTE — ED Provider Notes (Signed)
History     CSN: [id number]  Arrival date & time 05/22/11  1019   First MD Initiated Contact with Patient 05/22/11 1034      Chief Complaint  Patient presents with  . Nasal Congestion  . Cough  . Pleurisy    (Consider location/radiation/quality/duration/timing/severity/associated sxs/prior treatment) HPI Patient is a 31 year old female who presents today complaining of substernal chest pain that she rates as a 10 out of 10. Patient says this is worse with coughing and deep breathing. Patient has had a recent URI. She denies any fevers. She states that her cough is productive of yellow-green sputum. Patient has no history of DVT or PE. She has no concerning risk factors for thromboembolic disease. Patient also has no history of health problems other than asthma. She does report that her pain may be somewhat like her asthma symptoms. She did not have albuterol at home. She's not been treated with prednisone for years. There are no other associated or modifying factors the Past Medical History  Diagnosis Date  . Asthma   . Migraines   . Ovarian cyst, right     2 surgeries    Past Surgical History  Procedure Date  . Ovarian cyst surgery 2006 & 2009    Family History  Problem Relation Age of Onset  . Anesthesia problems Neg Hx   . Hypotension Neg Hx   . Malignant hyperthermia Neg Hx   . Pseudochol deficiency Neg Hx     History  Substance Use Topics  . Smoking status: Never Smoker   . Smokeless tobacco: Not on file  . Alcohol Use: Yes    OB History    Grav Para Term Preterm Abortions TAB SAB Ect Mult Living   0 0 0 0 0 0 0 0 0 0       Review of Systems  Constitutional: Negative.   HENT: Negative.   Eyes: Negative.   Respiratory: Positive for chest tightness.   Cardiovascular: Positive for chest pain.  Gastrointestinal: Negative.   Genitourinary: Negative.   Musculoskeletal: Negative.   Skin: Negative.   Neurological: Negative.   Hematological: Negative.     Psychiatric/Behavioral: Negative.   All other systems reviewed and are negative.    Allergies  Chocolate and Morphine and related  Home Medications   Current Outpatient Rx  Name Route Sig Dispense Refill  . ALBUTEROL SULFATE HFA 108 (90 BASE) MCG/ACT IN AERS Inhalation Inhale 2 puffs into the lungs every 6 (six) hours as needed. For athsma    . ASPIRIN 325 MG PO TABS Oral Take 650 mg by mouth every 6 (six) hours as needed.    . ASPIRIN-ACETAMINOPHEN-CAFFEINE 250-250-65 MG PO TABS Oral Take 2 tablets by mouth every 6 (six) hours as needed.    Marland Kitchen NAPROXEN SODIUM 550 MG PO TABS Oral Take 1 tablet (550 mg total) by mouth 2 (two) times daily with a meal. 20 tablet 0  . OXYCODONE-ACETAMINOPHEN 5-325 MG PO TABS Oral Take 2 tablets by mouth every 6 (six) hours as needed for pain. 10 tablet 0  . PREDNISONE 20 MG PO TABS Oral Take 3 tablets (60 mg total) by mouth once. 15 tablet 0  . SULFAMETHOXAZOLE-TRIMETHOPRIM 800-160 MG PO TABS Oral Take 1 tablet by mouth every 12 (twelve) hours. 6 tablet 0    BP 108/58  Pulse 78  Temp(Src) 98.1 F (36.7 C) (Oral)  Resp 22  SpO2 100%  LMP 04/29/2011  Physical Exam  Nursing note and vitals reviewed. GEN: Well-developed, well-nourished  female in no distress HEENT: Atraumatic, normocephalic. Oropharynx clear without erythema EYES: PERRLA BL, no scleral icterus. NECK: Trachea midline, no meningismus CV: regular rate and rhythm. No murmurs, rubs, or gallops PULM: No respiratory distress.  No crackles, wheezes, or rales. Decreased breath sounds throughout. GI: soft, non-tender. No guarding, rebound, or tenderness. + bowel sounds  Neuro: cranial nerves 2-12 intact, no abnormalities of strength or sensation, A and O x 3 MSK: Patient moves all 4 extremities symmetrically, no deformity, edema, or injury noted Psych: no abnormality of mood   ED Course  Procedures (including critical care time)   Date: 05/22/2011  Rate: 71  Rhythm: normal sinus rhythm  and sinus arrhythmia  QRS Axis: normal  Intervals: normal  ST/T Wave abnormalities: normal  Conduction Disutrbances:none  Narrative Interpretation: normal  Old EKG Reviewed: none available    Labs Reviewed  D-DIMER, QUANTITATIVE   Dg Chest 2 View  05/22/2011  *RADIOLOGY REPORT*  Clinical Data: Chest pain, cough.  CHEST - 2 VIEW  Comparison: 11/11/2009  Findings: Lungs clear.  Heart size and pulmonary vascularity normal.  No effusion.  Visualized bones unremarkable.  IMPRESSION: No acute disease  Original Report Authenticated By: Thora Lance III, M.D.     1. Chest pain   2. Asthma exacerbation       MDM  Patient is a 31 year old female who presents today complaining of substernal chest pain. EKG and chest x-ray are unremarkable. Patient did have decreased breath sounds. She was treated with prednisone and albuterol and had improvement in her symptoms. Patient also was given ibuprofen as well as narcotic pain medication. Patient received one dose oral medication with Percocet. Patient's symptoms improved. Patient was given an albuterol inhaler here to be discharged home with. She was discharged with prescription for 5 days of prednisone as well as 10 tabs of Percocet. Patient did describe variation in her symptoms with deep breathing and d-dimer was ordered and was negative. Patient was discharged in good condition. She was told to followup with her regular Dr. as needed.        Cyndra Numbers, MD 05/22/11 1510

## 2011-05-22 NOTE — ED Notes (Signed)
Pt recently being treated for URI and sore throat.  Was given viscous lidocaine and pt relates she has been having some chest pains.  Pt states increase in nasal congestion, productive cough of white to yellow to green sputum and headache.  No known fever.  Pt admits to cold chills. Pt curled up in bed, speaking in squeaky voice, and hard to understand.

## 2011-06-23 ENCOUNTER — Encounter (HOSPITAL_BASED_OUTPATIENT_CLINIC_OR_DEPARTMENT_OTHER): Payer: Self-pay | Admitting: *Deleted

## 2011-06-23 ENCOUNTER — Emergency Department (HOSPITAL_BASED_OUTPATIENT_CLINIC_OR_DEPARTMENT_OTHER)
Admission: EM | Admit: 2011-06-23 | Discharge: 2011-06-23 | Disposition: A | Discharge status: Home / Self Care | Payer: BC Managed Care – PPO | Attending: Emergency Medicine | Admitting: Emergency Medicine

## 2011-06-23 DIAGNOSIS — IMO0001 Reserved for inherently not codable concepts without codable children: Secondary | ICD-10-CM | POA: Insufficient documentation

## 2011-06-23 DIAGNOSIS — M542 Cervicalgia: Secondary | ICD-10-CM | POA: Insufficient documentation

## 2011-06-23 DIAGNOSIS — J45909 Unspecified asthma, uncomplicated: Secondary | ICD-10-CM | POA: Insufficient documentation

## 2011-06-23 DIAGNOSIS — M25519 Pain in unspecified shoulder: Secondary | ICD-10-CM

## 2011-06-23 MED ORDER — KETOROLAC TROMETHAMINE 60 MG/2ML IM SOLN
60.0000 mg | Freq: Once | INTRAMUSCULAR | Status: AC
  Administered 2011-06-23: 60 mg via INTRAMUSCULAR
  Filled 2011-06-23: qty 2

## 2011-06-23 MED ORDER — HYDROCODONE-ACETAMINOPHEN 5-325 MG PO TABS: 1.0000 | ORAL_TABLET | Freq: Four times a day (QID) | ORAL | Status: AC | PRN

## 2011-06-23 MED ORDER — IBUPROFEN 800 MG PO TABS: 800.0000 mg | ORAL_TABLET | Freq: Three times a day (TID) | ORAL | Status: AC

## 2011-06-23 MED ORDER — METHOCARBAMOL 500 MG PO TABS: 500.0000 mg | ORAL_TABLET | Freq: Two times a day (BID) | ORAL | Status: AC

## 2011-06-23 MED ORDER — HYDROCODONE-ACETAMINOPHEN 5-325 MG PO TABS
1.0000 | ORAL_TABLET | Freq: Once | ORAL | Status: AC
  Administered 2011-06-23: 1 via ORAL
  Filled 2011-06-23: qty 1

## 2011-06-23 MED ORDER — METHOCARBAMOL 500 MG PO TABS
500.0000 mg | ORAL_TABLET | Freq: Once | ORAL | Status: AC
  Administered 2011-06-23: 500 mg via ORAL
  Filled 2011-06-23: qty 1

## 2011-06-23 NOTE — ED Provider Notes (Signed)
History     CSN: [id number]  Arrival date & time 06/23/11  1715   First MD Initiated Contact with Patient 06/23/11 1727     5:52 PM HPI Patient reports waking up this morning with severe left neck and shoulder pain. Reports pain worse with movement and palpation. Denies any injury, fever, headache, chest pain, sore throat, ear pain. Reports similar symptoms this in the past. States she was placed in a sling and given muscle relaxants.   Patient is a 31 y.o. female presenting with shoulder pain. The history is provided by the patient.  Shoulder Pain This is a new problem. The current episode started today. The problem occurs constantly. The problem has been unchanged. Associated symptoms include myalgias and neck pain. Pertinent negatives include no chest pain, congestion, coughing, fatigue, fever, headaches, joint swelling, nausea, numbness, rash, sore throat, visual change, vomiting or weakness. Exacerbated by: movement of neck and head, palpation. She has tried NSAIDs for the symptoms. The treatment provided no relief.    Past Medical History  Diagnosis Date  . Asthma   . Migraines   . Ovarian cyst, right     2 surgeries    Past Surgical History  Procedure Date  . Ovarian cyst surgery 2006 & 2009    Family History  Problem Relation Age of Onset  . Anesthesia problems Neg Hx   . Hypotension Neg Hx   . Malignant hyperthermia Neg Hx   . Pseudochol deficiency Neg Hx     History  Substance Use Topics  . Smoking status: Never Smoker   . Smokeless tobacco: Not on file  . Alcohol Use: Yes    OB History    Grav Para Term Preterm Abortions TAB SAB Ect Mult Living   0 0 0 0 0 0 0 0 0 0       Review of Systems  Constitutional: Negative for fever and fatigue.  HENT: Positive for neck pain. Negative for congestion, sore throat and neck stiffness.   Respiratory: Negative for cough.   Cardiovascular: Negative for chest pain.  Gastrointestinal: Negative for nausea and vomiting.    Musculoskeletal: Positive for myalgias. Negative for joint swelling.  Skin: Negative for rash.  Neurological: Negative for dizziness, weakness, numbness and headaches.  All other systems reviewed and are negative.    Allergies  Chocolate and Morphine and related  Home Medications   Current Outpatient Rx  Name Route Sig Dispense Refill  . ALBUTEROL SULFATE HFA 108 (90 BASE) MCG/ACT IN AERS Inhalation Inhale 2 puffs into the lungs every 6 (six) hours as needed. For athsma    . IBUPROFEN 200 MG PO TABS Oral Take 200 mg by mouth every 6 (six) hours as needed. Patient used this medication for a migraine      BP 132/92  Pulse 70  Temp 98.4 F (36.9 C)  Resp 16  Ht 4\' 11"  (1.499 m)  Wt 121 lb (54.885 kg)  BMI 24.44 kg/m2  SpO2 100%  LMP 06/19/2011  Physical Exam  Vitals reviewed. Constitutional: She is oriented to person, place, and time. Vital signs are normal. She appears well-developed and well-nourished.  HENT:  Head: Normocephalic and atraumatic.  Eyes: Conjunctivae are normal. Pupils are equal, round, and reactive to light.  Neck: Neck supple. Muscular tenderness present. No spinous process tenderness present. Decreased range of motion present.    Cardiovascular: Normal rate, regular rhythm and normal heart sounds.  Exam reveals no friction rub.   No murmur heard. Pulmonary/Chest: Effort normal  and breath sounds normal. She has no wheezes. She has no rhonchi. She has no rales. She exhibits no tenderness.  Neurological: She is alert and oriented to person, place, and time. Coordination normal.  Skin: Skin is warm and dry. No rash noted. No erythema. No pallor.    ED Course  Procedures   MDM   Will give muscle relaxants and analgesics. We'll also provide followup with aura so if pain persists despite conservative therapy. Patient agrees to plan and is ready for discharge.    Thomasene Lot, PA-C 06/23/11 1802

## 2011-06-23 NOTE — ED Provider Notes (Signed)
Medical screening examination/treatment/procedure(s) were performed by non-physician practitioner and as supervising physician I was immediately available for consultation/collaboration.   Joya Gaskins, MD 06/23/11 2322

## 2011-06-23 NOTE — ED Notes (Signed)
Pt c/o neck and left shoulder pain w/o injury x 1 day

## 2011-08-23 ENCOUNTER — Emergency Department (HOSPITAL_COMMUNITY)
Admission: EM | Admit: 2011-08-23 | Discharge: 2011-08-23 | Disposition: A | Discharge status: Home / Self Care | Payer: BC Managed Care – PPO | Attending: Emergency Medicine | Admitting: Emergency Medicine

## 2011-08-23 ENCOUNTER — Encounter (HOSPITAL_COMMUNITY): Payer: Self-pay | Admitting: Emergency Medicine

## 2011-08-23 ENCOUNTER — Emergency Department (HOSPITAL_COMMUNITY): Payer: BC Managed Care – PPO

## 2011-08-23 DIAGNOSIS — J45909 Unspecified asthma, uncomplicated: Secondary | ICD-10-CM | POA: Insufficient documentation

## 2011-08-23 DIAGNOSIS — R091 Pleurisy: Secondary | ICD-10-CM

## 2011-08-23 DIAGNOSIS — R071 Chest pain on breathing: Secondary | ICD-10-CM | POA: Insufficient documentation

## 2011-08-23 DIAGNOSIS — R0602 Shortness of breath: Secondary | ICD-10-CM | POA: Insufficient documentation

## 2011-08-23 DIAGNOSIS — G43909 Migraine, unspecified, not intractable, without status migrainosus: Secondary | ICD-10-CM | POA: Insufficient documentation

## 2011-08-23 DIAGNOSIS — Z79899 Other long term (current) drug therapy: Secondary | ICD-10-CM | POA: Insufficient documentation

## 2011-08-23 HISTORY — DX: Pneumothorax, unspecified: J93.9

## 2011-08-23 LAB — CBC
MCHC: 32.1 g/dL (ref 30.0–36.0)
MCV: 85.7 fL (ref 78.0–100.0)
Platelets: 323 10*3/uL (ref 150–400)
RDW: 13.5 % (ref 11.5–15.5)
WBC: 10.5 10*3/uL (ref 4.0–10.5)

## 2011-08-23 LAB — BASIC METABOLIC PANEL
BUN: 10 mg/dL (ref 6–23)
CO2: 23 mEq/L (ref 19–32)
Glucose, Bld: 97 mg/dL (ref 70–99)
Potassium: 3.6 mEq/L (ref 3.5–5.1)

## 2011-08-23 MED ORDER — NAPROXEN 500 MG PO TABS: 500.0000 mg | ORAL_TABLET | Freq: Two times a day (BID) | ORAL | Status: DC

## 2011-08-23 MED ORDER — HYDROCODONE-ACETAMINOPHEN 5-325 MG PO TABS: 1.0000 | ORAL_TABLET | Freq: Four times a day (QID) | ORAL | Status: AC | PRN

## 2011-08-23 MED ORDER — FENTANYL CITRATE 0.05 MG/ML IJ SOLN
50.0000 ug | Freq: Once | INTRAMUSCULAR | Status: AC
  Administered 2011-08-23: 50 ug via INTRAVENOUS
  Filled 2011-08-23: qty 2

## 2011-08-23 NOTE — ED Provider Notes (Signed)
History     CSN: [id number]  Arrival date & time 08/23/11  2027   First MD Initiated Contact with Patient 08/23/11 2049      Chief Complaint  Patient presents with  . Shortness of Breath     HPI Pt started to have pain in her left chest about one hour ago.  She also has ben short of breath.  Pt tried using her inhaler without relief.  Pt states the pain was sharp on the left side.  Not radiating.  Constant.  She had some tingling and felt a bit lightheaded. No fevers.  Slight cough.  Pt has history of PTX on the same side in the past.  The pain increases with breathing and palpation. Past Medical History  Diagnosis Date  . Asthma   . Migraines   . Ovarian cyst, right     2 surgeries  . Pneumothorax     Past Surgical History  Procedure Date  . Ovarian cyst surgery 2006 & 2009    Family History  Problem Relation Age of Onset  . Anesthesia problems Neg Hx   . Hypotension Neg Hx   . Malignant hyperthermia Neg Hx   . Pseudochol deficiency Neg Hx     History  Substance Use Topics  . Smoking status: Never Smoker   . Smokeless tobacco: Not on file  . Alcohol Use: Yes    OB History    Grav Para Term Preterm Abortions TAB SAB Ect Mult Living   0 0 0 0 0 0 0 0 0 0       Review of Systems  All other systems reviewed and are negative.    Allergies  Chocolate and Morphine and related  Home Medications   Current Outpatient Rx  Name Route Sig Dispense Refill  . ALBUTEROL SULFATE HFA 108 (90 BASE) MCG/ACT IN AERS Inhalation Inhale 2 puffs into the lungs every 6 (six) hours as needed. For athsma    . IBUPROFEN 200 MG PO TABS Oral Take 200 mg by mouth every 6 (six) hours as needed. Patient used this medication for a migraine      BP 142/93  Pulse 70  Temp(Src) 98.7 F (37.1 C) (Oral)  Resp 17  SpO2 100%  LMP 06/11/2011  Physical Exam  Nursing note and vitals reviewed. Constitutional: She appears well-developed and well-nourished. No distress.  HENT:  Head:  Normocephalic and atraumatic.  Right Ear: External ear normal.  Left Ear: External ear normal.  Eyes: Conjunctivae are normal. Right eye exhibits no discharge. Left eye exhibits no discharge. No scleral icterus.  Neck: Neck supple. No tracheal deviation present.  Cardiovascular: Normal rate, regular rhythm and intact distal pulses.   Pulmonary/Chest: Effort normal and breath sounds normal. No stridor. No respiratory distress. She has no wheezes. She has no rales. She exhibits tenderness.  Abdominal: Soft. Bowel sounds are normal. She exhibits no distension. There is no tenderness. There is no rebound and no guarding.  Musculoskeletal: She exhibits no edema and no tenderness.  Neurological: She is alert. She has normal strength. No sensory deficit. Cranial nerve deficit:  no gross defecits noted. She exhibits normal muscle tone. She displays no seizure activity. Coordination normal.  Skin: Skin is warm and dry. No rash noted.  Psychiatric: She has a normal mood and affect.    ED Course  Procedures (including critical care time)  Rate: 74  Rhythm: normal sinus rhythm  QRS Axis: normal  Intervals: normal  ST/T Wave abnormalities: normal  Conduction Disutrbances:none  Narrative Interpretation: no sig changes  Old EKG Reviewed: none available  Labs Reviewed  CBC - Abnormal; Notable for the following:    Hemoglobin 11.6 (*)    All other components within normal limits  BASIC METABOLIC PANEL  D-DIMER, QUANTITATIVE   Dg Chest 2 View  08/23/2011  *RADIOLOGY REPORT*  Clinical Data: Shortness of breath.  Chest pain.  CHEST - 2 VIEW  Comparison: Chest x-ray 05/22/2011.  Findings: Lung volumes are normal.  No consolidative airspace disease.  No pleural effusions.  No pneumothorax.  No pulmonary nodule or mass noted.  Pulmonary vasculature and the cardiomediastinal silhouette are within normal limits.  IMPRESSION: 1. No radiographic evidence of acute cardiopulmonary disease.  Original Report  Authenticated By: Florencia Reasons, M.D.     MDM   patient is having pleuritic type chest pain. There is no evidence of recurrent pneumothorax. I doubt pulmonary embolism. She has reproducible chest wall tenderness. I suspect that her pain is related to that. The patient be discharged home with medications for pain. I encouraged followup with primary care Dr. to be reassessed if the symptoms persist.       Celene Kras, MD 08/23/11 2243

## 2011-08-23 NOTE — ED Notes (Signed)
Pt states she is having pain in her left chest that started about an hour ago  Lung sounds clear  Hx of pneumothorax in the past, unknown cause

## 2011-08-23 NOTE — Discharge Instructions (Signed)
Pleurisy  Pleurisy is an inflammation and swelling of the lining of the lungs. It usually is the result of an underlying infection or other disease. Because of this inflammation, it hurts to breathe. It is aggravated by coughing or deep breathing. The primary goal in treating pleurisy is to diagnose and treat the condition that caused it.   HOME CARE INSTRUCTIONS    Only take over-the-counter or prescription medicines for pain, discomfort, or fever as directed by your caregiver.   If medications which kill germs (antibiotics) were prescribed, take the entire course. Even if you are feeling better, you need to take them.   Use a cool mist vaporizer to help loosen secretions. This is so the secretions can be coughed up more easily.  SEEK MEDICAL CARE IF:    Your pain is not controlled with medication or is increasing.   You have an increase inpus like (purulent) secretions brought up with coughing.  SEEK IMMEDIATE MEDICAL CARE IF:    You have blue or dark lips, fingernails, or toenails.   You begin coughing up blood.   You have increased difficulty breathing.   You have continuing pain unrelieved by medicine or lasting more than 1 week.   You have pain that radiates into your neck, arms, or jaw.   You develop increased shortness of breath or wheezing.   You develop a fever, rash, vomiting, fainting, or other serious complaints.  Document Released: 03/08/2005 Document Revised: 02/25/2011 Document Reviewed: 10/07/2006  Exit[employer] Patient Information 2012 [employer]

## 2011-08-23 NOTE — ED Notes (Signed)
[id number]<AV> Expected date:<BR> Expected time: 8:22 PM<BR> Means of arrival:<BR> Comments:<BR> M120 - 30yoF SOB, hx pneumo

## 2011-08-23 NOTE — ED Notes (Signed)
Patient transported to X-ray 

## 2011-10-12 ENCOUNTER — Encounter (HOSPITAL_BASED_OUTPATIENT_CLINIC_OR_DEPARTMENT_OTHER): Payer: Self-pay | Admitting: Emergency Medicine

## 2011-10-12 ENCOUNTER — Emergency Department (HOSPITAL_BASED_OUTPATIENT_CLINIC_OR_DEPARTMENT_OTHER): Payer: BC Managed Care – PPO

## 2011-10-12 ENCOUNTER — Emergency Department (HOSPITAL_BASED_OUTPATIENT_CLINIC_OR_DEPARTMENT_OTHER)
Admission: EM | Admit: 2011-10-12 | Discharge: 2011-10-12 | Disposition: A | Discharge status: Home / Self Care | Payer: BC Managed Care – PPO | Attending: Emergency Medicine | Admitting: Emergency Medicine

## 2011-10-12 DIAGNOSIS — Z79899 Other long term (current) drug therapy: Secondary | ICD-10-CM | POA: Insufficient documentation

## 2011-10-12 DIAGNOSIS — J45909 Unspecified asthma, uncomplicated: Secondary | ICD-10-CM | POA: Insufficient documentation

## 2011-10-12 DIAGNOSIS — M25569 Pain in unspecified knee: Secondary | ICD-10-CM | POA: Insufficient documentation

## 2011-10-12 DIAGNOSIS — X500XXA Overexertion from strenuous movement or load, initial encounter: Secondary | ICD-10-CM | POA: Insufficient documentation

## 2011-10-12 NOTE — ED Provider Notes (Signed)
History     CSN: [id number]  Arrival date & time 10/12/11  1554   First MD Initiated Contact with Patient 10/12/11 1604      Chief Complaint  Patient presents with  . Knee Pain    (Consider location/radiation/quality/duration/timing/severity/associated sxs/prior treatment) HPI Comments: 31 y/o female here with left knee pain s/p trying to push her car out of the street yesterday. States she felt a "pop" in the back of her knee. Admits to swelling later that night. She took ibuprofen with some relief and applied ice. Pain worse with bending. Having trouble walking. Admits to tingling sensation down her leg last night which is not present today.  Patient is a 31 y.o. female presenting with knee pain. The history is provided by the patient.  Knee Pain Associated symptoms include joint swelling (left knee). Pertinent negatives include no chest pain.    Past Medical History  Diagnosis Date  . Asthma   . Migraines   . Ovarian cyst, right     2 surgeries  . Pneumothorax     Past Surgical History  Procedure Date  . Ovarian cyst surgery 2006 & 2009    Family History  Problem Relation Age of Onset  . Anesthesia problems Neg Hx   . Hypotension Neg Hx   . Malignant hyperthermia Neg Hx   . Pseudochol deficiency Neg Hx     History  Substance Use Topics  . Smoking status: Never Smoker   . Smokeless tobacco: Not on file  . Alcohol Use: Yes    OB History    Grav Para Term Preterm Abortions TAB SAB Ect Mult Living   0 0 0 0 0 0 0 0 0 0       Review of Systems  Respiratory: Negative for shortness of breath.   Cardiovascular: Negative for chest pain.  Musculoskeletal: Positive for joint swelling (left knee) and gait problem.       Left knee pain  Skin: Negative for color change.    Allergies  Chocolate and Morphine and related  Home Medications   Current Outpatient Rx  Name Route Sig Dispense Refill  . ALBUTEROL SULFATE HFA 108 (90 BASE) MCG/ACT IN AERS Inhalation  Inhale 2 puffs into the lungs every 6 (six) hours as needed. For athsma      LMP 10/12/2011  Physical Exam  Constitutional: She is oriented to person, place, and time. She appears well-developed and well-nourished. No distress.  HENT:  Head: Normocephalic and atraumatic.  Eyes: Conjunctivae are normal.  Cardiovascular: Normal rate, regular rhythm, normal heart sounds and intact distal pulses.   Pulmonary/Chest: Effort normal and breath sounds normal.  Musculoskeletal:       Left knee: She exhibits decreased range of motion and swelling (mild along lateral joint line). She exhibits no ecchymosis, no deformity and no erythema. tenderness found. Medial joint line and lateral joint line tenderness noted.       TTP of popliteal space. Unable to perform special tests due to pain upon palpation.  Neurological: She is alert and oriented to person, place, and time. No sensory deficit.  Skin: Skin is warm. No erythema.  Psychiatric: She has a normal mood and affect. Her behavior is normal.    ED Course  Procedures (including critical care time)  Labs Reviewed - No data to display Dg Knee Complete 4 Views Left  10/12/2011  *RADIOLOGY REPORT*  Clinical Data: Knee pain  LEFT KNEE - COMPLETE 4+ VIEW  Comparison: None.  Findings: Four views  of the left knee submitted.  No acute fracture or subluxation.  No radiopaque foreign body.  No joint effusion.  IMPRESSION: No acute fracture or subluxation.  Original Report Authenticated By: Natasha Mead, M.D.     1. Knee pain       MDM  31 y/o with knee pain s/p pushing her car off the street yesterday. xrays show no acute fracture. Special tests unable to be performed due to pain. Explained ligaments and tendons do not show up on xray and will need to follow up with orthopedics for further evaluation. No evidence of neurovascular compromise. Will apply knee immobilizer, give crutches, and encouage rest, ice, elevation,  anti-inflammatories.        Trevor Mace, PA-C 10/12/11 1652

## 2011-10-12 NOTE — ED Provider Notes (Signed)
Medical screening examination/treatment/procedure(s) were performed by non-physician practitioner and as supervising physician I was immediately available for consultation/collaboration.  Tito Ausmus, MD 10/12/11 1925 

## 2011-10-12 NOTE — ED Notes (Signed)
States while pushing stalled car yesterday, felt a pop behind lt knee

## 2011-11-24 ENCOUNTER — Emergency Department (HOSPITAL_BASED_OUTPATIENT_CLINIC_OR_DEPARTMENT_OTHER)
Admission: EM | Admit: 2011-11-24 | Discharge: 2011-11-24 | Disposition: A | Discharge status: Home / Self Care | Payer: BC Managed Care – PPO | Attending: Emergency Medicine | Admitting: Emergency Medicine

## 2011-11-24 ENCOUNTER — Encounter (HOSPITAL_BASED_OUTPATIENT_CLINIC_OR_DEPARTMENT_OTHER): Payer: Self-pay | Admitting: Emergency Medicine

## 2011-11-24 DIAGNOSIS — R110 Nausea: Secondary | ICD-10-CM

## 2011-11-24 DIAGNOSIS — J45909 Unspecified asthma, uncomplicated: Secondary | ICD-10-CM | POA: Insufficient documentation

## 2011-11-24 DIAGNOSIS — R0602 Shortness of breath: Secondary | ICD-10-CM | POA: Insufficient documentation

## 2011-11-24 LAB — URINALYSIS, ROUTINE W REFLEX MICROSCOPIC
Leukocytes, UA: NEGATIVE
Nitrite: NEGATIVE
Specific Gravity, Urine: 1.027 (ref 1.005–1.030)
pH: 5.5 (ref 5.0–8.0)

## 2011-11-24 LAB — PREGNANCY, URINE: Preg Test, Ur: NEGATIVE

## 2011-11-24 MED ORDER — ONDANSETRON 8 MG PO TBDP
8.0000 mg | ORAL_TABLET | Freq: Once | ORAL | Status: AC
  Administered 2011-11-24: 8 mg via ORAL
  Filled 2011-11-24: qty 1

## 2011-11-24 MED ORDER — ALBUTEROL SULFATE HFA 108 (90 BASE) MCG/ACT IN AERS
2.0000 | INHALATION_SPRAY | RESPIRATORY_TRACT | Status: DC
  Administered 2011-11-24: 2 via RESPIRATORY_TRACT
  Filled 2011-11-24: qty 6.7

## 2011-11-24 NOTE — ED Provider Notes (Signed)
History     CSN: [id number]  Arrival date & time 11/24/11  1909   First MD Initiated Contact with Patient 11/24/11 1927      Chief Complaint  Patient presents with  . Shortness of Breath    (Consider location/radiation/quality/duration/timing/severity/associated sxs/prior treatment) Patient is a 31 y.o. female presenting with shortness of breath. The history is provided by the patient.  Shortness of Breath  Associated symptoms include shortness of breath.   patient here complaining of nausea that enter into shortness of breath. History of asthma and is Route inhaler. Denies any abdominal pain or fever. No diarrhea. States her last menstrual period was 2 weeks ago and lasted 2 days. No vaginal bleeding or discharge. Nothing makes her nausea better or worse. Denies any urinary symptoms. Called EMS and was transported  Past Medical History  Diagnosis Date  . Asthma   . Migraines   . Ovarian cyst, right     2 surgeries  . Pneumothorax     Past Surgical History  Procedure Date  . Ovarian cyst surgery 2006 & 2009    Family History  Problem Relation Age of Onset  . Anesthesia problems Neg Hx   . Hypotension Neg Hx   . Malignant hyperthermia Neg Hx   . Pseudochol deficiency Neg Hx     History  Substance Use Topics  . Smoking status: Never Smoker   . Smokeless tobacco: Not on file  . Alcohol Use: No    OB History    Grav Para Term Preterm Abortions TAB SAB Ect Mult Living   0 0 0 0 0 0 0 0 0 0       Review of Systems  Respiratory: Positive for shortness of breath.   All other systems reviewed and are negative.    Allergies  Chocolate and Morphine and related  Home Medications   Current Outpatient Rx  Name Route Sig Dispense Refill  . ALBUTEROL SULFATE HFA 108 (90 BASE) MCG/ACT IN AERS Inhalation Inhale 2 puffs into the lungs every 6 (six) hours as needed. For athsma      BP 116/84  Pulse 83  Temp 99.1 F (37.3 C) (Oral)  Resp 16  Ht 4\' 10"  (1.473 m)   Wt 117 lb (53.071 kg)  BMI 24.45 kg/m2  SpO2 99%  LMP 11/09/2011  Physical Exam  Nursing note and vitals reviewed. Constitutional: She is oriented to person, place, and time. She appears well-developed and well-nourished.  Non-toxic appearance. No distress.  HENT:  Head: Normocephalic and atraumatic.  Eyes: Conjunctivae, EOM and lids are normal. Pupils are equal, round, and reactive to light.  Neck: Normal range of motion. Neck supple. No tracheal deviation present. No mass present.  Cardiovascular: Normal rate, regular rhythm and normal heart sounds.  Exam reveals no gallop.   No murmur heard. Pulmonary/Chest: Effort normal and breath sounds normal. No stridor. No respiratory distress. She has no decreased breath sounds. She has no wheezes. She has no rhonchi. She has no rales.  Abdominal: Soft. Normal appearance and bowel sounds are normal. She exhibits no distension. There is no tenderness. There is no rebound and no CVA tenderness.  Musculoskeletal: Normal range of motion. She exhibits no edema and no tenderness.  Neurological: She is alert and oriented to person, place, and time. She has normal strength. No cranial nerve deficit or sensory deficit. GCS eye subscore is 4. GCS verbal subscore is 5. GCS motor subscore is 6.  Skin: Skin is warm and dry. No abrasion  and no rash noted.  Psychiatric: She has a normal mood and affect. Her speech is normal and behavior is normal.    ED Course  Procedures (including critical care time)   Labs Reviewed  PREGNANCY, URINE  URINALYSIS, ROUTINE W REFLEX MICROSCOPIC   No results found.   No diagnosis found.    MDM  Patient given Zofran 4 nausea). Lung exam remains clear. Patient stable for discharge        Toy Baker, MD 11/24/11 2051

## 2011-11-24 NOTE — ED Notes (Addendum)
Pt was at work. Started having SOB.  Took her inhaler but pt did not perceive relief.  Upon EMS arrival breath sounds clear.  Pulse Ox 100%.  No resp distress noted..  Requested transport to ED for further eval stating that she was almost out of her medication and her meds are expired. Pt also reports nausea.  No vomiting per EMS. Left sided posterior rib pain and tenderness that she has had since February, and a mild HA.

## 2012-01-03 ENCOUNTER — Encounter (HOSPITAL_COMMUNITY): Payer: Self-pay | Admitting: *Deleted

## 2012-01-03 DIAGNOSIS — R10819 Abdominal tenderness, unspecified site: Secondary | ICD-10-CM | POA: Insufficient documentation

## 2012-01-03 DIAGNOSIS — R1031 Right lower quadrant pain: Secondary | ICD-10-CM | POA: Insufficient documentation

## 2012-01-03 DIAGNOSIS — B9689 Other specified bacterial agents as the cause of diseases classified elsewhere: Secondary | ICD-10-CM | POA: Insufficient documentation

## 2012-01-03 DIAGNOSIS — A499 Bacterial infection, unspecified: Secondary | ICD-10-CM | POA: Insufficient documentation

## 2012-01-03 DIAGNOSIS — N760 Acute vaginitis: Secondary | ICD-10-CM | POA: Insufficient documentation

## 2012-01-03 DIAGNOSIS — N83209 Unspecified ovarian cyst, unspecified side: Secondary | ICD-10-CM | POA: Insufficient documentation

## 2012-01-03 LAB — CBC WITH DIFFERENTIAL/PLATELET
Basophils Absolute: 0 10*3/uL (ref 0.0–0.1)
Eosinophils Relative: 1 % (ref 0–5)
Lymphocytes Relative: 29 % (ref 12–46)
MCV: 83.3 fL (ref 78.0–100.0)
Platelets: 342 10*3/uL (ref 150–400)
RDW: 13 % (ref 11.5–15.5)
WBC: 9.2 10*3/uL (ref 4.0–10.5)

## 2012-01-03 LAB — COMPREHENSIVE METABOLIC PANEL
ALT: <5 U/L (ref 0–35)
AST: 10 U/L (ref 0–37)
CO2: 24 mEq/L (ref 19–32)
Calcium: 9.5 mg/dL (ref 8.4–10.5)
GFR calc non Af Amer: >90 mL/min (ref >90)
Sodium: 139 mEq/L (ref 135–145)
Total Protein: 7.5 g/dL (ref 6.0–8.3)

## 2012-01-03 LAB — URINE MICROSCOPIC-ADD ON

## 2012-01-03 LAB — URINALYSIS, ROUTINE W REFLEX MICROSCOPIC
Bilirubin Urine: NEGATIVE
Nitrite: NEGATIVE
Specific Gravity, Urine: 1.038 — ABNORMAL HIGH (ref 1.005–1.030)
Urobilinogen, UA: 1 mg/dL (ref 0.0–1.0)

## 2012-01-03 LAB — PREGNANCY, URINE: Preg Test, Ur: NEGATIVE

## 2012-01-03 NOTE — ED Notes (Signed)
C/o rt lower abd pain since this am  With heavy vaginal   Bleeding.  n v  lmp  aug

## 2012-01-04 ENCOUNTER — Encounter (HOSPITAL_COMMUNITY): Payer: Self-pay | Admitting: Radiology

## 2012-01-04 ENCOUNTER — Emergency Department (HOSPITAL_COMMUNITY)
Admission: EM | Admit: 2012-01-04 | Discharge: 2012-01-04 | Disposition: A | Discharge status: Home / Self Care | Payer: BC Managed Care – PPO | Attending: Emergency Medicine | Admitting: Emergency Medicine

## 2012-01-04 ENCOUNTER — Emergency Department (HOSPITAL_COMMUNITY): Payer: BC Managed Care – PPO

## 2012-01-04 DIAGNOSIS — N760 Acute vaginitis: Secondary | ICD-10-CM

## 2012-01-04 DIAGNOSIS — N83209 Unspecified ovarian cyst, unspecified side: Secondary | ICD-10-CM

## 2012-01-04 LAB — WET PREP, GENITAL

## 2012-01-04 MED ORDER — IOHEXOL 300 MG/ML  SOLN
20.0000 mL | INTRAMUSCULAR | Status: AC
  Administered 2012-01-04: 20 mL via ORAL

## 2012-01-04 MED ORDER — METRONIDAZOLE 500 MG PO TABS: 500.0000 mg | ORAL_TABLET | Freq: Two times a day (BID) | ORAL | Status: DC

## 2012-01-04 MED ORDER — HYDROMORPHONE HCL PF 1 MG/ML IJ SOLN
1.0000 mg | Freq: Once | INTRAMUSCULAR | Status: AC
  Administered 2012-01-04: 1 mg via INTRAVENOUS
  Filled 2012-01-04: qty 1

## 2012-01-04 MED ORDER — NAPROXEN 500 MG PO TABS: 500.0000 mg | ORAL_TABLET | Freq: Two times a day (BID) | ORAL | Status: DC

## 2012-01-04 MED ORDER — SODIUM CHLORIDE 0.9 % IV BOLUS (SEPSIS)
1000.0000 mL | Freq: Once | INTRAVENOUS | Status: AC
  Administered 2012-01-04: 1000 mL via INTRAVENOUS

## 2012-01-04 MED ORDER — ONDANSETRON 4 MG PO TBDP
ORAL_TABLET | ORAL | Status: AC
  Filled 2012-01-04: qty 2

## 2012-01-04 MED ORDER — IOHEXOL 300 MG/ML  SOLN
100.0000 mL | Freq: Once | INTRAMUSCULAR | Status: AC | PRN
  Administered 2012-01-04: 100 mL via INTRAVENOUS

## 2012-01-04 MED ORDER — ONDANSETRON 4 MG PO TBDP
8.0000 mg | ORAL_TABLET | Freq: Once | ORAL | Status: AC
  Administered 2012-01-04: 8 mg via ORAL

## 2012-01-04 NOTE — ED Notes (Signed)
md at bedside for pelvic.  meds given per md order

## 2012-01-04 NOTE — ED Notes (Signed)
Advised of wait time. 

## 2012-01-04 NOTE — ED Notes (Signed)
Pt finished drinking PO contrast. CT notified.

## 2012-01-04 NOTE — ED Notes (Signed)
Pt dc to home. Ambulatory to exit.

## 2012-01-04 NOTE — ED Provider Notes (Signed)
History     CSN: [id number]  Arrival date & time 01/03/12  2207   First MD Initiated Contact with Patient 01/04/12 0225      Chief Complaint  Patient presents with  . Abdominal Pain    (Consider location/radiation/quality/duration/timing/severity/associated sxs/prior treatment) HPI Comments: 31 year old female with a history of ovarian cysts, asthma who presents with right lower quadrant abdominal pain which had acute onset approximately 36 hours ago, has been persistent, gradually worsening and located in the right lower quadrant. There is associated vomiting, no associated diarrhea or fevers. She does have a history of ovarian cysts in that same area but says that she has unpredictable menses, has had a small amount of vaginal bleeding. Her urine pregnancy test is negative. The symptoms are gradually worsening, sharp stabbing and severe. She has some radiation to the right lower back  Patient is a 31 y.o. female presenting with abdominal pain. The history is provided by the patient and a relative.  Abdominal Pain The primary symptoms of the illness include abdominal pain.    Past Medical History  Diagnosis Date  . Asthma   . Migraines   . Ovarian cyst, right     2 surgeries  . Pneumothorax     Past Surgical History  Procedure Date  . Ovarian cyst surgery 2006 & 2009    Family History  Problem Relation Age of Onset  . Anesthesia problems Neg Hx   . Hypotension Neg Hx   . Malignant hyperthermia Neg Hx   . Pseudochol deficiency Neg Hx     History  Substance Use Topics  . Smoking status: Never Smoker   . Smokeless tobacco: Not on file  . Alcohol Use: No    OB History    Grav Para Term Preterm Abortions TAB SAB Ect Mult Living   0 0 0 0 0 0 0 0 0 0       Review of Systems  Gastrointestinal: Positive for abdominal pain.  All other systems reviewed and are negative.    Allergies  Chocolate and Morphine and related  Home Medications   Current Outpatient  Rx  Name Route Sig Dispense Refill  . ALBUTEROL SULFATE HFA 108 (90 BASE) MCG/ACT IN AERS Inhalation Inhale 2 puffs into the lungs every 6 (six) hours as needed. For shortness of breath    . BUDESONIDE-FORMOTEROL FUMARATE 80-4.5 MCG/ACT IN AERO Inhalation Inhale 3 puffs into the lungs 2 (two) times daily.    Marland Kitchen NAPROXEN 500 MG PO TABS Oral Take 1 tablet (500 mg total) by mouth 2 (two) times daily with a meal. 30 tablet 0    BP 113/78  Pulse 68  Temp 98.2 F (36.8 C) (Oral)  Resp 18  SpO2 99%  LMP 11/03/2011  Physical Exam  Nursing note and vitals reviewed. Constitutional: She appears well-developed and well-nourished. No distress.  HENT:  Head: Normocephalic and atraumatic.  Mouth/Throat: Oropharynx is clear and moist. No oropharyngeal exudate.  Eyes: Conjunctivae normal and EOM are normal. Pupils are equal, round, and reactive to light. Right eye exhibits no discharge. Left eye exhibits no discharge. No scleral icterus.  Neck: Normal range of motion. Neck supple. No JVD present. No thyromegaly present.  Cardiovascular: Normal rate, regular rhythm, normal heart sounds and intact distal pulses.  Exam reveals no gallop and no friction rub.   No murmur heard. Pulmonary/Chest: Effort normal and breath sounds normal. No respiratory distress. She has no wheezes. She has no rales.  Abdominal: Soft. Bowel sounds are normal.  She exhibits no distension and no mass. There is tenderness ( Focal right lower quadrant tenderness at McBurney's point, suprapubic tenderness, mild epigastric and right upper quadrant tenderness. No masses, no peritoneal signs, no Rovsing sign).       No CVA tenderness  Genitourinary:       Chaperone present for vaginal exam, blood from cervix, os open, no cervical motion tenderness, mild right adnexal tenderness, no left adnexal tenderness, no masses palpated.  Musculoskeletal: Normal range of motion. She exhibits no edema and no tenderness.  Lymphadenopathy:    She has  no cervical adenopathy.  Neurological: She is alert. Coordination normal.  Skin: Skin is warm and dry. No rash noted. No erythema.  Psychiatric: She has a normal mood and affect. Her behavior is normal.    ED Course  Procedures (including critical care time)  Labs Reviewed  URINALYSIS, ROUTINE W REFLEX MICROSCOPIC - Abnormal; Notable for the following:    Color, Urine RED (*)  BIOCHEMICALS MAY BE AFFECTED BY COLOR   APPearance TURBID (*)     Specific Gravity, Urine 1.038 (*)     Hgb urine dipstick LARGE (*)     Ketones, ur 15 (*)     Protein, ur 100 (*)     Leukocytes, UA MODERATE (*)     All other components within normal limits  CBC WITH DIFFERENTIAL - Abnormal; Notable for the following:    Hemoglobin 11.9 (*)     HCT 35.5 (*)     All other components within normal limits  COMPREHENSIVE METABOLIC PANEL - Abnormal; Notable for the following:    Glucose, Bld 106 (*)     All other components within normal limits  URINE MICROSCOPIC-ADD ON - Abnormal; Notable for the following:    Squamous Epithelial / LPF MANY (*)     Bacteria, UA MANY (*)     All other components within normal limits  WET PREP, GENITAL - Abnormal; Notable for the following:    Clue Cells Wet Prep HPF POC MODERATE (*)     WBC, Wet Prep HPF POC FEW (*)     All other components within normal limits  PREGNANCY, URINE  LIPASE, BLOOD  GC/CHLAMYDIA PROBE AMP, GENITAL  URINE CULTURE   Ct Abdomen Pelvis W Contrast  01/04/2012  *RADIOLOGY REPORT*  Clinical Data: Right lower quadrant abdominal pain.  Heavy vaginal bleeding.  CT ABDOMEN AND PELVIS WITH CONTRAST  Technique:  Multidetector CT imaging of the abdomen and pelvis was performed following the standard protocol during bolus administration of intravenous contrast.  Contrast: 1 OMNIPAQUE IOHEXOL 300 MG/ML  SOLN, OMNIPAQUE IOHEXOL 300 MG/ML  SOLN  Comparison: CT abdomen and pelvis 04/08/2010.  Ultrasound pelvis 04/07/2011.  Findings: The lung bases are clear.   The stomach is distended without wall thickening.  Contrast material is present in the small bowel suggesting no evidence of complete outlet obstruction.  Gastroparesis or hypomotility are not excluded.  The liver, spleen, gallbladder, pancreas, adrenal glands, kidneys, abdominal aorta, and retroperitoneal lymph nodes are unremarkable. The small bowel and colon are not distended.  No free air or free fluid in the abdomen.  Pelvis:  Cystic structure in the right ovary measures about 3 cm diameter.  This was present previously and is demonstrated on ultrasound.  The uterus and left ovary are not enlarged.  There is a small amount of free pelvic fluid which may be physiologic.  No loculated fluid collection or abscess.  No diverticulitis.  The appendix is  segmentally identified and appears normal.  Normal alignment of the lumbar vertebrae.  IMPRESSION: Right ovarian cyst as previously demonstrated at ultrasound.  Small amount of free fluid in the pelvis is likely physiologic.  Gastric distension without wall thickening or outlet obstruction.   Original Report Authenticated By: Marlon Pel, M.D.      1. Ovarian cyst   2. Bacterial vaginosis       MDM  Concern for either acute appendicitis or ovarian cyst, she does not have peritoneal signs, less likely to be rupture of the cyst. Pregnancy test negative, urinalysis contaminated with many bacteria and too numerous to count red blood cells with many squamous epithelials. She does have ketones in her urine, fluids given, basic metabolic panel and liver function normal.   CT scan performed, no signs of acute appendicitis, right ovarian cyst present, bacterial vaginosis present, urine culture obtained, home on Naprosyn and Flagyl.    Vida Roller, MD 01/04/12 [phone]

## 2012-01-04 NOTE — ED Notes (Signed)
Patient returned from CT

## 2012-01-05 LAB — GC/CHLAMYDIA PROBE AMP, GENITAL
Chlamydia, DNA Probe: NEGATIVE
GC Probe Amp, Genital: NEGATIVE

## 2012-01-07 LAB — URINE CULTURE

## 2012-01-10 NOTE — ED Notes (Signed)
Chart sent to EDP office for review. Likely contaminant. No abx treatment necessary at this time.Follow up as directed per [facility].

## 2012-03-20 ENCOUNTER — Encounter (HOSPITAL_BASED_OUTPATIENT_CLINIC_OR_DEPARTMENT_OTHER): Payer: Self-pay

## 2012-03-20 ENCOUNTER — Emergency Department (HOSPITAL_BASED_OUTPATIENT_CLINIC_OR_DEPARTMENT_OTHER): Payer: BC Managed Care – PPO

## 2012-03-20 ENCOUNTER — Emergency Department (HOSPITAL_COMMUNITY)
Admission: EM | Admit: 2012-03-20 | Discharge: 2012-03-20 | Discharge status: Against Medical Advice | Payer: BC Managed Care – PPO | Attending: Emergency Medicine | Admitting: Emergency Medicine

## 2012-03-20 ENCOUNTER — Encounter (HOSPITAL_COMMUNITY): Payer: Self-pay | Admitting: *Deleted

## 2012-03-20 ENCOUNTER — Emergency Department (HOSPITAL_BASED_OUTPATIENT_CLINIC_OR_DEPARTMENT_OTHER)
Admission: EM | Admit: 2012-03-20 | Discharge: 2012-03-20 | Disposition: A | Discharge status: Home / Self Care | Payer: BC Managed Care – PPO | Attending: Emergency Medicine | Admitting: Emergency Medicine

## 2012-03-20 DIAGNOSIS — R0602 Shortness of breath: Secondary | ICD-10-CM | POA: Insufficient documentation

## 2012-03-20 DIAGNOSIS — J45909 Unspecified asthma, uncomplicated: Secondary | ICD-10-CM | POA: Insufficient documentation

## 2012-03-20 DIAGNOSIS — R071 Chest pain on breathing: Secondary | ICD-10-CM | POA: Insufficient documentation

## 2012-03-20 DIAGNOSIS — Z8742 Personal history of other diseases of the female genital tract: Secondary | ICD-10-CM | POA: Insufficient documentation

## 2012-03-20 DIAGNOSIS — Z8679 Personal history of other diseases of the circulatory system: Secondary | ICD-10-CM | POA: Insufficient documentation

## 2012-03-20 DIAGNOSIS — Z8709 Personal history of other diseases of the respiratory system: Secondary | ICD-10-CM | POA: Insufficient documentation

## 2012-03-20 DIAGNOSIS — R259 Unspecified abnormal involuntary movements: Secondary | ICD-10-CM | POA: Insufficient documentation

## 2012-03-20 DIAGNOSIS — R0789 Other chest pain: Secondary | ICD-10-CM

## 2012-03-20 DIAGNOSIS — Z79899 Other long term (current) drug therapy: Secondary | ICD-10-CM | POA: Insufficient documentation

## 2012-03-20 MED ORDER — HYDROCODONE-ACETAMINOPHEN 5-325 MG PO TABS: 2.0000 | ORAL_TABLET | ORAL | Status: DC | PRN

## 2012-03-20 MED ORDER — IBUPROFEN 800 MG PO TABS: 800.0000 mg | ORAL_TABLET | Freq: Three times a day (TID) | ORAL | Status: DC

## 2012-03-20 MED ORDER — HYDROCODONE-ACETAMINOPHEN 5-325 MG PO TABS
2.0000 | ORAL_TABLET | Freq: Once | ORAL | Status: AC
  Administered 2012-03-20: 2 via ORAL
  Filled 2012-03-20: qty 2

## 2012-03-20 NOTE — ED Notes (Addendum)
C/o pain to left breast area (pt points to left rib/axilla,brest area) and sob x 3 days-pt talking in full sentences-pt was at [facility] ED and left without being seen-states EKG was done

## 2012-03-20 NOTE — ED Notes (Signed)
Pt. Reports in full sentences that she was seen in the early part of the year for the same problem.

## 2012-03-20 NOTE — ED Notes (Signed)
Pt states she's been having severe L flank spasms and shortness of breath, hx of this happening before, pt denies n/v/d or urinary symptoms.

## 2012-03-20 NOTE — ED Provider Notes (Signed)
History     CSN: [id number]  Arrival date & time 03/20/12  1936   None     Chief Complaint  Patient presents with  . Breast Pain  . Shortness of Breath    (Consider location/radiation/quality/duration/timing/severity/associated sxs/prior treatment) Patient is a 31 y.o. female presenting with musculoskeletal pain. The history is provided by the patient. No language interpreter was used.  Muscle Pain This is a new problem. Episode onset: 3 days. The problem occurs constantly. The problem has been gradually worsening. Pertinent negatives include no coughing or fever. Nothing aggravates the symptoms. She has tried nothing for the symptoms.  Pt complains of pain in left side and left chest.  Pt reports she has had multiple episodes in the past. No fever, no chills.   Past Medical History  Diagnosis Date  . Asthma   . Migraines   . Ovarian cyst, right     2 surgeries  . Pneumothorax     Past Surgical History  Procedure Date  . Ovarian cyst surgery 2006 & 2009    Family History  Problem Relation Age of Onset  . Anesthesia problems Neg Hx   . Hypotension Neg Hx   . Malignant hyperthermia Neg Hx   . Pseudochol deficiency Neg Hx     History  Substance Use Topics  . Smoking status: Never Smoker   . Smokeless tobacco: Not on file  . Alcohol Use: No    OB History    Grav Para Term Preterm Abortions TAB SAB Ect Mult Living   0 0 0 0 0 0 0 0 0 0       Review of Systems  Constitutional: Negative for fever.  Respiratory: Negative for cough.   All other systems reviewed and are negative.    Allergies  Chocolate and Morphine and related  Home Medications   Current Outpatient Rx  Name  Route  Sig  Dispense  Refill  . ALBUTEROL SULFATE HFA 108 (90 BASE) MCG/ACT IN AERS   Inhalation   Inhale 2 puffs into the lungs every 6 (six) hours as needed. For shortness of breath         . NAPROXEN 500 MG PO TABS   Oral   Take 500 mg by mouth 2 (two) times daily as needed.  For pain.           BP 135/100  Pulse 82  Temp 98.2 F (36.8 C) (Oral)  Resp 20  Ht 4\' 10"  (1.473 m)  Wt 119 lb (53.978 kg)  BMI 24.87 kg/m2  SpO2 99%  LMP 02/14/2012  Physical Exam  Vitals reviewed. Constitutional: She is oriented to person, place, and time. She appears well-developed and well-nourished.  HENT:  Head: Normocephalic.  Right Ear: External ear normal.  Left Ear: External ear normal.  Nose: Nose normal.  Mouth/Throat: Oropharynx is clear and moist.  Eyes: Conjunctivae normal and EOM are normal. Pupils are equal, round, and reactive to light.  Neck: Normal range of motion. Neck supple.  Cardiovascular: Normal rate and normal heart sounds.   Pulmonary/Chest: Effort normal.  Abdominal: Soft. Bowel sounds are normal.  Musculoskeletal: Normal range of motion.  Neurological: She is alert and oriented to person, place, and time. She has normal reflexes.  Skin: Skin is warm.  Psychiatric: She has a normal mood and affect.    ED Course  Procedures (including critical care time)  Labs Reviewed - No data to display Dg Chest 2 View  03/20/2012  *RADIOLOGY REPORT*  Clinical  Data: 31 year old female with shortness of breath and pain.  CHEST - 2 VIEW  Comparison: 08/23/2011 and earlier.  Findings: Larger lung volumes.  Cardiac size and mediastinal contours are within normal limits.  Visualized tracheal air column is within normal limits.  The lungs are clear.  No pneumothorax or effusion. No acute osseous abnormality identified.  IMPRESSION: Negative, no acute cardiopulmonary abnormality.   Original Report Authenticated By: Erskine Speed, M.D.      No diagnosis found.    MDM  Pt given 2 hydrocodone here for pain.   Pain is reproducible by palpation,  Pt not tachycardic, 02 sat 99.   I doubt Pe.  Pain seems muscular.  Chest xray normal.   Pt given rx for hydrocodone and ibuprofen       Lonia Skinner [place], [place] 03/20/12 2259

## 2012-03-23 NOTE — ED Provider Notes (Signed)
Medical screening examination/treatment/procedure(s) were performed by non-physician practitioner and as supervising physician I was immediately available for consultation/collaboration.  Marwan T Powers, MD 03/23/12 0705 

## 2012-09-13 ENCOUNTER — Emergency Department (HOSPITAL_BASED_OUTPATIENT_CLINIC_OR_DEPARTMENT_OTHER)
Admission: EM | Admit: 2012-09-13 | Discharge: 2012-09-14 | Disposition: A | Discharge status: Home / Self Care | Payer: BC Managed Care – PPO | Attending: Emergency Medicine | Admitting: Emergency Medicine

## 2012-09-13 ENCOUNTER — Encounter (HOSPITAL_BASED_OUTPATIENT_CLINIC_OR_DEPARTMENT_OTHER): Payer: Self-pay | Admitting: Emergency Medicine

## 2012-09-13 DIAGNOSIS — R0781 Pleurodynia: Secondary | ICD-10-CM

## 2012-09-13 DIAGNOSIS — Z8742 Personal history of other diseases of the female genital tract: Secondary | ICD-10-CM | POA: Insufficient documentation

## 2012-09-13 DIAGNOSIS — Z79899 Other long term (current) drug therapy: Secondary | ICD-10-CM | POA: Insufficient documentation

## 2012-09-13 DIAGNOSIS — Z8709 Personal history of other diseases of the respiratory system: Secondary | ICD-10-CM | POA: Insufficient documentation

## 2012-09-13 DIAGNOSIS — R079 Chest pain, unspecified: Secondary | ICD-10-CM | POA: Insufficient documentation

## 2012-09-13 DIAGNOSIS — Z8679 Personal history of other diseases of the circulatory system: Secondary | ICD-10-CM | POA: Insufficient documentation

## 2012-09-13 DIAGNOSIS — J45901 Unspecified asthma with (acute) exacerbation: Secondary | ICD-10-CM

## 2012-09-13 DIAGNOSIS — R0602 Shortness of breath: Secondary | ICD-10-CM | POA: Insufficient documentation

## 2012-09-13 DIAGNOSIS — R0789 Other chest pain: Secondary | ICD-10-CM

## 2012-09-13 MED ORDER — ALBUTEROL SULFATE (5 MG/ML) 0.5% IN NEBU
5.0000 mg | INHALATION_SOLUTION | Freq: Once | RESPIRATORY_TRACT | Status: AC
  Administered 2012-09-13: 5 mg via RESPIRATORY_TRACT
  Filled 2012-09-13: qty 1

## 2012-09-13 MED ORDER — IPRATROPIUM BROMIDE 0.02 % IN SOLN
0.5000 mg | Freq: Once | RESPIRATORY_TRACT | Status: AC
  Administered 2012-09-13: 0.5 mg via RESPIRATORY_TRACT
  Filled 2012-09-13: qty 2.5

## 2012-09-13 NOTE — ED Notes (Signed)
Pt states she thinks her oxygen level is dropping. Oxygen level checked. 100% on RA. No wheezing noted.

## 2012-09-13 NOTE — ED Notes (Signed)
Pt c/o asthma exacerbation x 2 hours ago. Pt used neb tx just PTA

## 2012-09-14 MED ORDER — NAPROXEN 250 MG PO TABS
500.0000 mg | ORAL_TABLET | Freq: Once | ORAL | Status: AC
  Administered 2012-09-14: 500 mg via ORAL
  Filled 2012-09-14: qty 2

## 2012-09-14 NOTE — ED Provider Notes (Signed)
History    CSN: [id number] Arrival date & time 09/13/12  2138  First MD Initiated Contact with Patient 09/13/12 2328     Chief Complaint  Patient presents with  . Asthma   (Consider location/radiation/quality/duration/timing/severity/associated sxs/prior Treatment) HPI This is a 32 year old female history of asthma. She states her asthma acutely worsened about 8 PM at work. She attributes this exacerbation to it being very cold at work; she has a history of cold induced asthma symptoms. She used her home nebulizer without adequate relief and so came here. Accompanying the shortness of breath has been a sharp pain in her left lower posterior ribs, worse with deep breathing or palpation. She denies injury to this area.   Past Medical History  Diagnosis Date  . Asthma   . Migraines   . Ovarian cyst, right     2 surgeries  . Pneumothorax    Past Surgical History  Procedure Laterality Date  . Ovarian cyst surgery  2006 & 2009   Family History  Problem Relation Age of Onset  . Anesthesia problems Neg Hx   . Hypotension Neg Hx   . Malignant hyperthermia Neg Hx   . Pseudochol deficiency Neg Hx    History  Substance Use Topics  . Smoking status: Never Smoker   . Smokeless tobacco: Not on file  . Alcohol Use: No   OB History   Grav Para Term Preterm Abortions TAB SAB Ect Mult Living   0 0 0 0 0 0 0 0 0 0      Review of Systems  All other systems reviewed and are negative.    Allergies  Chocolate and Morphine and related  Home Medications   Current Outpatient Rx  Name  Route  Sig  Dispense  Refill  . albuterol (PROVENTIL HFA;VENTOLIN HFA) 108 (90 BASE) MCG/ACT inhaler   Inhalation   Inhale 2 puffs into the lungs every 6 (six) hours as needed. For shortness of breath         . ipratropium-albuterol (DUONEB) 0.5-2.5 (3) MG/3ML SOLN   Nebulization   Take 3 mLs by nebulization every 6 (six) hours as needed.         . naproxen (NAPROSYN) 500 MG tablet   Oral  Take 500 mg by mouth 2 (two) times daily as needed. For pain.         Marland Kitchen HYDROcodone-acetaminophen (NORCO/VICODIN) 5-325 MG per tablet   Oral   Take 2 tablets by mouth every 4 (four) hours as needed for pain.   10 tablet   0   . ibuprofen (ADVIL,MOTRIN) 800 MG tablet   Oral   Take 1 tablet (800 mg total) by mouth 3 (three) times daily.   21 tablet   0    BP 133/86  Pulse 74  Temp(Src) 98.5 F (36.9 C) (Oral)  Resp 18  Ht 4\' 10"  (1.473 m)  Wt 126 lb (57.153 kg)  BMI 26.34 kg/m2  SpO2 100% Physical Exam General: Well-developed, well-nourished female in no acute distress; appearance consistent with age of record HENT: normocephalic, atraumatic Eyes: pupils equal round and reactive to light; extraocular muscles intact Neck: supple Heart: regular rate and rhythm Lungs: Decreased air movement bilaterally without frank wheezing Chest: Left lower posterior rib tenderness without deformity or crepitus Abdomen: soft; nondistended Extremities: No deformity; full range of motion Neurologic: Awake, alert and oriented; motor function intact in all extremities and symmetric; no facial droop Skin: Warm and dry Psychiatric: Normal mood and affect  ED Course  Procedures (including critical care time)   MDM  12:36 AM Air movement improved after albuterol Atrovent treatment in the ED. Pain is improved but is still present.  Hanley Seamen, MD 09/14/12 [id number]

## 2012-09-14 NOTE — ED Notes (Signed)
MD at bedside. 

## 2012-12-16 ENCOUNTER — Encounter (HOSPITAL_BASED_OUTPATIENT_CLINIC_OR_DEPARTMENT_OTHER): Payer: Self-pay

## 2012-12-16 ENCOUNTER — Emergency Department (HOSPITAL_BASED_OUTPATIENT_CLINIC_OR_DEPARTMENT_OTHER)
Admission: EM | Admit: 2012-12-16 | Discharge: 2012-12-16 | Disposition: A | Discharge status: Home / Self Care | Payer: BC Managed Care – PPO | Attending: Emergency Medicine | Admitting: Emergency Medicine

## 2012-12-16 DIAGNOSIS — J45909 Unspecified asthma, uncomplicated: Secondary | ICD-10-CM | POA: Insufficient documentation

## 2012-12-16 DIAGNOSIS — Z8742 Personal history of other diseases of the female genital tract: Secondary | ICD-10-CM | POA: Insufficient documentation

## 2012-12-16 DIAGNOSIS — G5602 Carpal tunnel syndrome, left upper limb: Secondary | ICD-10-CM

## 2012-12-16 DIAGNOSIS — G5600 Carpal tunnel syndrome, unspecified upper limb: Secondary | ICD-10-CM | POA: Insufficient documentation

## 2012-12-16 DIAGNOSIS — Z8679 Personal history of other diseases of the circulatory system: Secondary | ICD-10-CM | POA: Insufficient documentation

## 2012-12-16 DIAGNOSIS — Z79899 Other long term (current) drug therapy: Secondary | ICD-10-CM | POA: Insufficient documentation

## 2012-12-16 DIAGNOSIS — M25532 Pain in left wrist: Secondary | ICD-10-CM

## 2012-12-16 MED ORDER — PREDNISONE 10 MG PO TABS: 20.0000 mg | ORAL_TABLET | Freq: Two times a day (BID) | ORAL | Status: DC

## 2012-12-16 MED ORDER — HYDROCODONE-ACETAMINOPHEN 5-325 MG PO TABS: 2.0000 | ORAL_TABLET | ORAL | Status: DC | PRN

## 2012-12-16 NOTE — ED Provider Notes (Signed)
CSN: [id number]     Arrival date & time 12/16/12  1306 History   First MD Initiated Contact with Patient 12/16/12 1306     Chief Complaint  Patient presents with  . Wrist Pain   (Consider location/radiation/quality/duration/timing/severity/associated sxs/prior Treatment) HPI Comments: Patient is a 32 year old female with history of asthma migraine headaches. She presents today with complaints of pain in the left wrist. 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 She denies any injury or trauma. She denies any fevers or chills.  Patient is a 32 y.o. female presenting with wrist pain. The history is provided by the patient.  Wrist Pain This is a new problem. Episode onset: One week ago. The problem occurs constantly. The problem has been gradually worsening. Exacerbated by: Movement, palpation. Nothing relieves the symptoms. She has tried nothing for the symptoms. The treatment provided no relief.    Past Medical History  Diagnosis Date  . Asthma   . Migraines   . Ovarian cyst, right     2 surgeries  . Pneumothorax    Past Surgical History  Procedure Laterality Date  . Ovarian cyst surgery  2006 & 2009   Family History  Problem Relation Age of Onset  . Anesthesia problems Neg Hx   . Hypotension Neg Hx   . Malignant hyperthermia Neg Hx   . Pseudochol deficiency Neg Hx    History  Substance Use Topics  . Smoking status: Never Smoker   . Smokeless tobacco: Not on file  . Alcohol Use: No   OB History   Grav Para Term Preterm Abortions TAB SAB Ect Mult Living   0 0 0 0 0 0 0 0 0 0      Review of Systems  All other systems reviewed and are negative.    Allergies  Chocolate and Morphine and related  Home Medications   Current Outpatient Rx  Name  Route  Sig  Dispense  Refill  . albuterol (PROVENTIL HFA;VENTOLIN HFA) 108 (90 BASE) MCG/ACT inhaler   Inhalation   Inhale 2 puffs into the lungs every 6 (six) hours as needed. For shortness of  breath         . ipratropium-albuterol (DUONEB) 0.5-2.5 (3) MG/3ML SOLN   Nebulization   Take 3 mLs by nebulization every 6 (six) hours as needed.          BP 140/91  Pulse 84  Temp(Src) 98.6 F (37 C) (Oral)  Resp 16  SpO2 99%  LMP 11/06/2012 Physical Exam  Nursing note and vitals reviewed. Constitutional: She is oriented to person, place, and time. She appears well-developed and well-nourished. No distress.  HENT:  Head: Normocephalic and atraumatic.  Neck: Normal range of motion. Neck supple.  Musculoskeletal: Normal range of motion.  There is tenderness to palpation over the volar aspect of the wrist. Pain is worsened with movement of the wrist. There is no swelling or redness over the wrist joint and I am unable to appreciate an effusion. Distal fingers have adequate capillary refill and ulnar and radial pulses are easily palpable.  Neurological: She is alert and oriented to person, place, and time.  Skin: Skin is warm and dry. She is not diaphoretic.    ED Course  Procedures (including critical care time) Labs Review Labs Reviewed - No data to display Imaging Review No results found.  MDM  No diagnosis found. This is most likely carpal tunnel, however there is a possibility this  could be an inflammatory arthritis. She will be placed in a splint and treated with steroids and pain medications. She is not improving in the next week she needs to followup with her primary care Dr. to discuss further treatment.    Geoffery Lyons, MD 12/16/12 1324

## 2012-12-16 NOTE — ED Notes (Signed)
Patient here with left wrist pain x 2 weeks. 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 Has been using otc meds with minimal relief. Denies trauma

## 2013-01-05 ENCOUNTER — Encounter (HOSPITAL_COMMUNITY): Payer: Self-pay | Admitting: Emergency Medicine

## 2013-01-05 ENCOUNTER — Emergency Department (HOSPITAL_COMMUNITY)
Admission: EM | Admit: 2013-01-05 | Discharge: 2013-01-05 | Disposition: A | Discharge status: Home / Self Care | Payer: BC Managed Care – PPO | Source: Home / Self Care | Attending: Family Medicine | Admitting: Family Medicine

## 2013-01-05 DIAGNOSIS — J029 Acute pharyngitis, unspecified: Secondary | ICD-10-CM

## 2013-01-05 LAB — POCT INFECTIOUS MONO SCREEN: Mono Screen: NEGATIVE

## 2013-01-05 MED ORDER — LIDOCAINE VISCOUS 2 % MT SOLN: 15.0000 mL | OROMUCOSAL | Status: DC | PRN

## 2013-01-05 MED ORDER — KETOROLAC TROMETHAMINE 60 MG/2ML IM SOLN
60.0000 mg | Freq: Once | INTRAMUSCULAR | Status: AC
Start: 2013-01-05 — End: 2013-01-05
  Administered 2013-01-05: 60 mg via INTRAMUSCULAR

## 2013-01-05 MED ORDER — HYDROCODONE-ACETAMINOPHEN 5-325 MG PO TABS: 1.0000 | ORAL_TABLET | Freq: Four times a day (QID) | ORAL | Status: DC | PRN

## 2013-01-05 MED ORDER — METHYLPREDNISOLONE SODIUM SUCC 125 MG IJ SOLR
125.0000 mg | Freq: Once | INTRAMUSCULAR | Status: AC
  Administered 2013-01-05: 125 mg via INTRAMUSCULAR

## 2013-01-05 MED ORDER — KETOROLAC TROMETHAMINE 60 MG/2ML IM SOLN
INTRAMUSCULAR | Status: AC
  Filled 2013-01-05: qty 2

## 2013-01-05 MED ORDER — PREDNISONE 50 MG PO TABS: 50.0000 mg | ORAL_TABLET | Freq: Every day | ORAL | Status: DC

## 2013-01-05 MED ORDER — IBUPROFEN 800 MG PO TABS: 800.0000 mg | ORAL_TABLET | Freq: Three times a day (TID) | ORAL | Status: DC | PRN

## 2013-01-05 MED ORDER — IBUPROFEN 600 MG PO TABS: 600.0000 mg | ORAL_TABLET | Freq: Four times a day (QID) | ORAL | Status: DC | PRN

## 2013-01-05 MED ORDER — METHYLPREDNISOLONE SODIUM SUCC 125 MG IJ SOLR
INTRAMUSCULAR | Status: AC
  Filled 2013-01-05: qty 2

## 2013-01-05 NOTE — ED Provider Notes (Signed)
CSN: [id number]     Arrival date & time 01/05/13  1829 History   First MD Initiated Contact with Patient 01/05/13 1954     Chief Complaint  Patient presents with  . Sore Throat  . Hoarse   (Consider location/radiation/quality/duration/timing/severity/associated sxs/prior Treatment) HPI Comments: 32 year old female presents complaining of severe sore throat and losing her voice this morning. She has also had a low-grade fever. She also says that she has had 3 coughing fits where she coughed up green sputum and blood. She has taken Tylenol and drank some hot tea without relief. She is unable to swallow her saliva because it is too painful. She denies sick contacts or recent travel. Denies rash, NVD, leg swelling.  Patient is a 32 y.o. female presenting with pharyngitis.  Sore Throat Pertinent negatives include no chest pain, no abdominal pain and no shortness of breath.    Past Medical History  Diagnosis Date  . Asthma   . Migraines   . Ovarian cyst, right     2 surgeries  . Pneumothorax    Past Surgical History  Procedure Laterality Date  . Ovarian cyst surgery  2006 & 2009   Family History  Problem Relation Age of Onset  . Anesthesia problems Neg Hx   . Hypotension Neg Hx   . Malignant hyperthermia Neg Hx   . Pseudochol deficiency Neg Hx    History  Substance Use Topics  . Smoking status: Never Smoker   . Smokeless tobacco: Not on file  . Alcohol Use: No   OB History   Grav Para Term Preterm Abortions TAB SAB Ect Mult Living   0 0 0 0 0 0 0 0 0 0      Review of Systems  Constitutional: Negative for fever and chills.  HENT: Positive for sore throat.   Eyes: Negative for visual disturbance.  Respiratory: Positive for cough. Negative for shortness of breath.   Cardiovascular: Negative for chest pain, palpitations and leg swelling.  Gastrointestinal: Negative for nausea, vomiting and abdominal pain.  Endocrine: Negative for polydipsia and polyuria.  Genitourinary:  Negative for dysuria, urgency and frequency.  Musculoskeletal: Negative for arthralgias and myalgias.  Skin: Negative for rash.  Neurological: Positive for speech difficulty. Negative for dizziness, weakness and light-headedness.    Allergies  Chocolate and Morphine and related  Home Medications   Current Outpatient Rx  Name  Route  Sig  Dispense  Refill  . albuterol (PROVENTIL HFA;VENTOLIN HFA) 108 (90 BASE) MCG/ACT inhaler   Inhalation   Inhale 2 puffs into the lungs every 6 (six) hours as needed. For shortness of breath         . HYDROcodone-acetaminophen (NORCO) 5-325 MG per tablet   Oral   Take 2 tablets by mouth every 4 (four) hours as needed for pain.   10 tablet   0   . HYDROcodone-acetaminophen (NORCO/VICODIN) 5-325 MG per tablet   Oral   Take 1 tablet by mouth every 6 (six) hours as needed for pain.   15 tablet   0   . ibuprofen (ADVIL,MOTRIN) 600 MG tablet   Oral   Take 1 tablet (600 mg total) by mouth every 6 (six) hours as needed for pain.   20 tablet   0   . ipratropium-albuterol (DUONEB) 0.5-2.5 (3) MG/3ML SOLN   Nebulization   Take 3 mLs by nebulization every 6 (six) hours as needed.         . lidocaine (XYLOCAINE) 2 % solution   Oral  Take 15 mLs by mouth as needed for pain.   100 mL   0   . predniSONE (DELTASONE) 50 MG tablet   Oral   Take 1 tablet (50 mg total) by mouth daily.   5 tablet   0    BP 134/82  Pulse 102  Temp(Src) 98.6 F (37 C) (Oral)  Resp 16  SpO2 100%  LMP 11/06/2012 Physical Exam  Nursing note and vitals reviewed. Constitutional: She is oriented to person, place, and time. Vital signs are normal. She appears well-developed and well-nourished. No distress.  HENT:  Head: Normocephalic and atraumatic.  Right Ear: External ear normal.  Left Ear: External ear normal.  Nose: Nose normal.  Mouth/Throat: Oropharynx is clear and moist. No oropharyngeal exudate.  Neck: Normal range of motion. Neck supple.   Cardiovascular: Normal rate, regular rhythm and normal heart sounds.   Pulmonary/Chest: Effort normal and breath sounds normal. No respiratory distress.  Lymphadenopathy:    She has cervical adenopathy (tender in the area of the right tonsillar lymph nodes, not swollen).  Neurological: She is alert and oriented to person, place, and time. She has normal strength. Coordination normal.  Skin: Skin is warm and dry. No rash noted. She is not diaphoretic.  Psychiatric: She has a normal mood and affect. Judgment normal.    ED Course  Procedures (including critical care time) Labs Review Labs Reviewed  POCT INFECTIOUS MONO SCREEN   Imaging Review No results found.    MDM   1. Pharyngitis    This patient is very concerned because she is not regularly get sick. However, she is afebrile with normal vital signs the exception of a very slight tachycardia. I will treat her symptomatically and have her followup tomorrow for a recheck, with the strict instructions to call 911 if any shortness of breath or closing of the throat. She is agreeable with this plan     Meds ordered this encounter  Medications  . HYDROcodone-acetaminophen (NORCO/VICODIN) 5-325 MG per tablet    Sig: Take 1 tablet by mouth every 6 (six) hours as needed for pain.    Dispense:  15 tablet    Refill:  0    Order Specific Question:  Supervising Provider    Answer:  Clementeen Graham, S [id number]  . ibuprofen (ADVIL,MOTRIN) 600 MG tablet    Sig: Take 1 tablet (600 mg total) by mouth every 6 (six) hours as needed for pain.    Dispense:  20 tablet    Refill:  0    Order Specific Question:  Supervising Provider    Answer:  Clementeen Graham, S [id number]  . lidocaine (XYLOCAINE) 2 % solution    Sig: Take 15 mLs by mouth as needed for pain.    Dispense:  100 mL    Refill:  0    Order Specific Question:  Supervising Provider    Answer:  Clementeen Graham, S [id number]  . predniSONE (DELTASONE) 50 MG tablet    Sig: Take 1 tablet (50 mg total) by  mouth daily.    Dispense:  5 tablet    Refill:  0    Order Specific Question:  Supervising Provider    Answer:  Clementeen Graham, S [id number]  . ketorolac (TORADOL) injection 60 mg    Sig:   . methylPREDNISolone sodium succinate (SOLU-MEDROL) 125 mg/2 mL injection 125 mg    Sig:      Graylon Good, PA-C 01/05/13 2130

## 2013-01-05 NOTE — ED Notes (Signed)
C/o severe sore throat with loss of voice onset this a .m. Pt has drank hot tea with no relief. Low grade temp. Took otc fever reducer.   Denies any other symptoms.

## 2013-01-06 ENCOUNTER — Emergency Department (INDEPENDENT_AMBULATORY_CARE_PROVIDER_SITE_OTHER)
Admission: EM | Admit: 2013-01-06 | Discharge: 2013-01-06 | Disposition: A | Discharge status: Home / Self Care | Payer: BC Managed Care – PPO | Source: Home / Self Care | Attending: Family Medicine | Admitting: Family Medicine

## 2013-01-06 ENCOUNTER — Encounter (HOSPITAL_COMMUNITY): Payer: Self-pay | Admitting: Emergency Medicine

## 2013-01-06 DIAGNOSIS — J029 Acute pharyngitis, unspecified: Secondary | ICD-10-CM

## 2013-01-06 NOTE — ED Notes (Signed)
Pt c/o sore throat onset yesterday that she was seen and treated for yesterday. Was told to be rechecked this morning. Last night pt reports she felt worse, fever and chills. Pt reports Stomach cramps. Unable to start Rx last night. Pt also has productive cough onset last night. Pt is alert and oriented.

## 2013-01-06 NOTE — ED Provider Notes (Signed)
CSN: [id number]     Arrival date & time 01/06/13  4098 History   First MD Initiated Contact with Patient 01/05/13 1954     Chief Complaint  Patient presents with  . Sore Throat   (Consider location/radiation/quality/duration/timing/severity/associated sxs/prior Treatment) HPI Comments: Patient presents for followup of sore throat. The sore throat is getting better, her voice is returning, although she does have a cough now. No chest pain or shortness of breath.  Patient is a 32 y.o. female presenting with pharyngitis.  Sore Throat Pertinent negatives include no chest pain, no abdominal pain and no shortness of breath.    Past Medical History  Diagnosis Date  . Asthma   . Migraines   . Ovarian cyst, right     2 surgeries  . Pneumothorax    Past Surgical History  Procedure Laterality Date  . Ovarian cyst surgery  2006 & 2009   Family History  Problem Relation Age of Onset  . Anesthesia problems Neg Hx   . Hypotension Neg Hx   . Malignant hyperthermia Neg Hx   . Pseudochol deficiency Neg Hx    History  Substance Use Topics  . Smoking status: Never Smoker   . Smokeless tobacco: Not on file  . Alcohol Use: No   OB History   Grav Para Term Preterm Abortions TAB SAB Ect Mult Living   0 0 0 0 0 0 0 0 0 0      Review of Systems  Constitutional: Negative for fever and chills.  HENT: Positive for sore throat.   Eyes: Negative for visual disturbance.  Respiratory: Positive for cough. Negative for shortness of breath.   Cardiovascular: Negative for chest pain, palpitations and leg swelling.  Gastrointestinal: Negative for nausea, vomiting and abdominal pain.  Endocrine: Negative for polydipsia and polyuria.  Genitourinary: Negative for dysuria, urgency and frequency.  Musculoskeletal: Negative for arthralgias and myalgias.  Skin: Negative for rash.  Neurological: Positive for speech difficulty. Negative for dizziness, weakness and light-headedness.    Allergies   Chocolate and Morphine and related  Home Medications   Current Outpatient Rx  Name  Route  Sig  Dispense  Refill  . albuterol (PROVENTIL HFA;VENTOLIN HFA) 108 (90 BASE) MCG/ACT inhaler   Inhalation   Inhale 2 puffs into the lungs every 6 (six) hours as needed. For shortness of breath         . HYDROcodone-acetaminophen (NORCO) 5-325 MG per tablet   Oral   Take 2 tablets by mouth every 4 (four) hours as needed for pain.   10 tablet   0   . HYDROcodone-acetaminophen (NORCO/VICODIN) 5-325 MG per tablet   Oral   Take 1 tablet by mouth every 6 (six) hours as needed for pain.   15 tablet   0   . ibuprofen (ADVIL,MOTRIN) 600 MG tablet   Oral   Take 1 tablet (600 mg total) by mouth every 6 (six) hours as needed for pain.   20 tablet   0   . ipratropium-albuterol (DUONEB) 0.5-2.5 (3) MG/3ML SOLN   Nebulization   Take 3 mLs by nebulization every 6 (six) hours as needed.         . lidocaine (XYLOCAINE) 2 % solution   Oral   Take 15 mLs by mouth as needed for pain.   100 mL   0   . predniSONE (DELTASONE) 50 MG tablet   Oral   Take 1 tablet (50 mg total) by mouth daily.   5 tablet  0    BP 127/86  Pulse 79  Temp(Src) 98.3 F (36.8 C) (Oral)  Resp 18  SpO2 100%  LMP 11/06/2012 Physical Exam  Nursing note and vitals reviewed. Constitutional: She is oriented to person, place, and time. Vital signs are normal. She appears well-developed and well-nourished. No distress.  HENT:  Head: Normocephalic and atraumatic.  Right Ear: External ear normal.  Left Ear: External ear normal.  Nose: Nose normal.  Mouth/Throat: Oropharynx is clear and moist. No oropharyngeal exudate.  Neck: Normal range of motion. Neck supple.  Cardiovascular: Normal rate, regular rhythm and normal heart sounds.   Pulmonary/Chest: Effort normal and breath sounds normal. No respiratory distress.  Lymphadenopathy:    She has cervical adenopathy (tender in the area of the right tonsillar lymph  nodes, not swollen).  Neurological: She is alert and oriented to person, place, and time. She has normal strength. Coordination normal.  Skin: Skin is warm and dry. No rash noted. She is not diaphoretic.  Psychiatric: She has a normal mood and affect. Judgment normal.    ED Course  Procedures  (including critical care time) Labs Review Labs Reviewed - No data to display Imaging Review No results found.    MDM   1. Sore throat    She is improving appropriately. Continue the plan as laid out yesterday. Followup as needed.    Graylon Good, PA-C 01/06/13 1112

## 2013-01-06 NOTE — ED Provider Notes (Signed)
Medical screening examination/treatment/procedure(s) were performed by a resident physician or non-physician practitioner and as the supervising physician I was immediately available for consultation/collaboration.  Mccoy Testa, MD  Korrey Schleicher S Claris Pech, MD 01/06/13 0844 

## 2013-01-07 LAB — CULTURE, GROUP A STREP

## 2013-01-08 NOTE — ED Provider Notes (Signed)
Medical screening examination/treatment/procedure(s) were performed by a resident physician or non-physician practitioner and as the supervising physician I was immediately available for consultation/collaboration.  Kaliyah Gladman, MD    Ailynn Gow S Lamica Mccart, MD 01/08/13 0949 

## 2013-05-04 ENCOUNTER — Ambulatory Visit: Admit: 2013-05-04 | Payer: Self-pay | Admitting: Obstetrics and Gynecology

## 2013-05-04 SURGERY — EXCISION, CYST, OVARY, LAPAROSCOPIC
Anesthesia: General

## 2013-08-02 ENCOUNTER — Emergency Department (HOSPITAL_COMMUNITY): Payer: BC Managed Care – PPO

## 2013-08-02 ENCOUNTER — Emergency Department (HOSPITAL_COMMUNITY)
Admission: EM | Admit: 2013-08-02 | Discharge: 2013-08-02 | Disposition: A | Discharge status: Home / Self Care | Payer: BC Managed Care – PPO | Attending: Emergency Medicine | Admitting: Emergency Medicine

## 2013-08-02 ENCOUNTER — Encounter (HOSPITAL_COMMUNITY): Payer: Self-pay | Admitting: Emergency Medicine

## 2013-08-02 DIAGNOSIS — Z79899 Other long term (current) drug therapy: Secondary | ICD-10-CM | POA: Insufficient documentation

## 2013-08-02 DIAGNOSIS — R071 Chest pain on breathing: Secondary | ICD-10-CM | POA: Insufficient documentation

## 2013-08-02 DIAGNOSIS — Z8679 Personal history of other diseases of the circulatory system: Secondary | ICD-10-CM | POA: Insufficient documentation

## 2013-08-02 DIAGNOSIS — Z8742 Personal history of other diseases of the female genital tract: Secondary | ICD-10-CM | POA: Insufficient documentation

## 2013-08-02 DIAGNOSIS — R0789 Other chest pain: Secondary | ICD-10-CM

## 2013-08-02 DIAGNOSIS — J45909 Unspecified asthma, uncomplicated: Secondary | ICD-10-CM

## 2013-08-02 MED ORDER — ALBUTEROL SULFATE HFA 108 (90 BASE) MCG/ACT IN AERS
2.0000 | INHALATION_SPRAY | Freq: Once | RESPIRATORY_TRACT | Status: AC
  Administered 2013-08-02: 2 via RESPIRATORY_TRACT
  Filled 2013-08-02: qty 6.7

## 2013-08-02 MED ORDER — DEXAMETHASONE SODIUM PHOSPHATE 10 MG/ML IJ SOLN
10.0000 mg | Freq: Once | INTRAMUSCULAR | Status: AC
  Administered 2013-08-02: 10 mg via INTRAMUSCULAR
  Filled 2013-08-02: qty 1

## 2013-08-02 NOTE — ED Notes (Signed)
Bed: WA21 Expected date:  Expected time:  Means of arrival:  Comments: EMS-cough/asthma

## 2013-08-02 NOTE — Discharge Instructions (Signed)
Asthma Attack Prevention Although there is no way to prevent asthma from starting, you can take steps to control the disease and reduce its symptoms. Learn about your asthma and how to control it. Take an active role to control your asthma by working with your health care provider to create and follow an asthma action plan. An asthma action plan guides you in:  Taking your medicines properly.  Avoiding things that set off your asthma or make your asthma worse (asthma triggers).  Tracking your level of asthma control.  Responding to worsening asthma.  Seeking emergency care when needed. To track your asthma, keep records of your symptoms, check your peak flow number using a handheld device that shows how well air moves out of your lungs (peak flow meter), and get regular asthma checkups.  WHAT ARE SOME WAYS TO PREVENT AN ASTHMA ATTACK?  Take medicines as directed by your health care provider.  Keep track of your asthma symptoms and level of control.  With your health care provider, write a detailed plan for taking medicines and managing an asthma attack. Then be sure to follow your action plan. Asthma is an ongoing condition that needs regular monitoring and treatment.  Identify and avoid asthma triggers. Many outdoor allergens and irritants (such as pollen, mold, cold air, and air pollution) can trigger asthma attacks. Find out what your asthma triggers are and take steps to avoid them.  Monitor your breathing. Learn to recognize warning signs of an attack, such as coughing, wheezing, or shortness of breath. Your lung function may decrease before you notice any signs or symptoms, so regularly measure and record your peak airflow with a home peak flow meter.  Identify and treat attacks early. If you act quickly, you are less likely to have a severe attack. You will also need less medicine to control your symptoms. When your peak flow measurements decrease and alert you to an upcoming attack,  take your medicine as instructed and immediately stop any activity that may have triggered the attack. If your symptoms do not improve, get medical help.  Pay attention to increasing quick-relief inhaler use. If you find yourself relying on your quick-relief inhaler, your asthma is not under control. See your health care provider about adjusting your treatment. WHAT CAN MAKE MY SYMPTOMS WORSE? A number of common things can set off or make your asthma symptoms worse and cause temporary increased inflammation of your airways. Keep track of your asthma symptoms for several weeks, detailing all the environmental and emotional factors that are linked with your asthma. When you have an asthma attack, go back to your asthma diary to see which factor, or combination of factors, might have contributed to it. Once you know what these factors are, you can take steps to control many of them. If you have allergies and asthma, it is important to take asthma prevention steps at home. Minimizing contact with the substance to which you are allergic will help prevent an asthma attack. Some triggers and ways to avoid these triggers are: Animal Dander:  Some people are allergic to the flakes of skin or dried saliva from animals with fur or feathers.   There is no such thing as a hypoallergenic dog or cat breed. All dogs or cats can cause allergies, even if they don't shed.  Keep these pets out of your home.  If you are not able to keep a pet outdoors, keep the pet out of your bedroom and other sleeping areas at all  times, and keep the door closed.  Remove carpets and furniture covered with cloth from your home. If that is not possible, keep the pet away from fabric-covered furniture and carpets. Dust Mites: Many people with asthma are allergic to dust mites. Dust mites are tiny bugs that are found in every home in mattresses, pillows, carpets, fabric-covered furniture, bedcovers, clothes, stuffed toys, and other  fabric-covered items.   Cover your mattress in a special dust-proof cover.  Cover your pillow in a special dust-proof cover, or wash the pillow each week in hot water. Water must be hotter than 130 F (54.4 C) to kill dust mites. Cold or warm water used with detergent and bleach can also be effective.  Wash the sheets and blankets on your bed each week in hot water.  Try not to sleep or lie on cloth-covered cushions.  Call ahead when traveling and ask for a smoke-free hotel room. Bring your own bedding and pillows in case the hotel only supplies feather pillows and down comforters, which may contain dust mites and cause asthma symptoms.  Remove carpets from your bedroom and those laid on concrete, if you can.  Keep stuffed toys out of the bed, or wash the toys weekly in hot water or cooler water with detergent and bleach. Cockroaches: Many people with asthma are allergic to the droppings and remains of cockroaches.   Keep food and garbage in closed containers. Never leave food out.  Use poison baits, traps, powders, gels, or paste (for example, boric acid).  If a spray is used to kill cockroaches, stay out of the room until the odor goes away. Indoor Mold:  Fix leaky faucets, pipes, or other sources of water that have mold around them.  Clean floors and moldy surfaces with a fungicide or diluted bleach.  Avoid using humidifiers, vaporizers, or swamp coolers. These can spread molds through the air. Pollen and Outdoor Mold:  When pollen or mold spore counts are high, try to keep your windows closed.  Stay indoors with windows closed from late morning to afternoon. Pollen and some mold spore counts are highest at that time.  Ask your health care provider whether you need to take anti-inflammatory medicine or increase your dose of the medicine before your allergy season starts. Other Irritants to Avoid:  Tobacco smoke is an irritant. If you smoke, ask your health care provider how  you can quit. Ask family members to quit smoking too. Do not allow smoking in your home or car.  If possible, do not use a wood-burning stove, kerosene heater, or fireplace. Minimize exposure to all sources of smoke, including to incense, candles, fires, and fireworks.  Try to stay away from strong odors and sprays, such as perfume, talcum powder, hair spray, and paints.  Decrease humidity in your home and use an indoor air cleaning device. Reduce indoor humidity to below 60%. Dehumidifiers or central air conditioners can do this.  Decrease house dust exposure by changing furnace and air cooler filters frequently.  Try to have someone else vacuum for you once or twice a week. Stay out of rooms while they are being vacuumed and for a short while afterward.  If you vacuum, use a dust mask from a hardware store, a double-layered or microfilter vacuum cleaner bag, or a vacuum cleaner with a HEPA filter.  Sulfites in foods and beverages can be irritants. Do not drink beer or wine or eat dried fruit, processed potatoes, or shrimp if they cause asthma symptoms.  Cold air can trigger an asthma attack. Cover your nose and mouth with a scarf on cold or windy days.  Several health conditions can make asthma more difficult to manage, including a runny nose, sinus infections, reflux disease, psychological stress, and sleep apnea. Work with your health care provider to manage these conditions.  Avoid close contact with people who have a respiratory infection such as a cold or the flu, since your asthma symptoms may get worse if you catch the infection. Wash your hands thoroughly after touching items that may have been handled by people with a respiratory infection.  Get a flu shot every year to protect against the flu virus, which often makes asthma worse for days or weeks. Also get a pneumonia shot if you have not previously had one. Unlike the flu shot, the pneumonia shot does not need to be given  yearly. Medicines:  Talk to your health care provider about whether it is safe for you to take aspirin or non-steroidal anti-inflammatory medicines (NSAIDs). In a small number of people with asthma, aspirin and NSAIDs can cause asthma attacks. These medicines must be avoided by people who have known aspirin-sensitive asthma. It is important that people with aspirin-sensitive asthma read labels of all over-the-counter medicines used to treat pain, colds, coughs, and fever.  Beta blockers and ACE inhibitors are other medicines you should discuss with your health care provider. HOW CAN I FIND OUT WHAT I AM ALLERGIC TO? Ask your asthma health care provider about allergy skin testing or blood testing (the RAST test) to identify the allergens to which you are sensitive. If you are found to have allergies, the most important thing to do is to try to avoid exposure to any allergens that you are sensitive to as much as possible. Other treatments for allergies, such as medicines and allergy shots (immunotherapy) are available.  CAN I EXERCISE? Follow your health care provider's advice regarding asthma treatment before exercising. It is important to maintain a regular exercise program, but vigorous exercise, or exercise in cold, humid, or dry environments can cause asthma attacks, especially for those people who have exercise-induced asthma. Document Released: 02/24/2009 Document Revised: 11/08/2012 Document Reviewed: 09/13/2012 [facility] Patient Information 2014 [place], [place].  Chest Wall Pain Chest wall pain is pain in or around the bones and muscles of your chest. It may take up to 6 weeks to get better. It may take longer if you must stay physically active in your work and activities.  CAUSES  Chest wall pain may happen on its own. However, it may be caused by:  A viral illness like the flu.  Injury.  Coughing.  Exercise.  Arthritis.  Fibromyalgia.  Shingles. HOME CARE INSTRUCTIONS   Avoid  overtiring physical activity. Try not to strain or perform activities that cause pain. This includes any activities using your chest or your abdominal and side muscles, especially if heavy weights are used.  Put ice on the sore area.  Put ice in a plastic bag.  Place a towel between your skin and the bag.  Leave the ice on for 15-20 minutes per hour while awake for the first 2 days.  Only take over-the-counter or prescription medicines for pain, discomfort, or fever as directed by your caregiver. SEEK IMMEDIATE MEDICAL CARE IF:   Your pain increases, or you are very uncomfortable.  You have a fever.  Your chest pain becomes worse.  You have new, unexplained symptoms.  You have nausea or vomiting.  You feel sweaty  or lightheaded.  You have a cough with phlegm (sputum), or you cough up blood. MAKE SURE YOU:   Understand these instructions.  Will watch your condition.  Will get help right away if you are not doing well or get worse. Document Released: 03/08/2005 Document Revised: 05/31/2011 Document Reviewed: 11/02/2010 [facility]Exit[facility] Patient Information 2014 [place]Care, [place]LL[place].

## 2013-08-02 NOTE — ED Notes (Signed)
Per EMS comes from home c/o chest wall pain after asthma attack earlier today. Pt states that central chest pain that is tightness and sharp.

## 2013-08-02 NOTE — ED Provider Notes (Signed)
CSN: [id number]     Arrival date & time 08/02/13  1820 History   First MD Initiated Contact with Patient 08/02/13 1900     Chief Complaint  Patient presents with  . Chest Pain      HPI Per EMS comes from home c/o chest wall pain after asthma attack earlier today. Pt states that central chest pain that is tightness and sharp.  Pain is better now.  Wheezing is improved.  Questionable history of pneumothorax in the past when I reviewed records from where she told me it it happened which was [facility] I could not find any definitive documentation for that diagnosis.  Past Medical History  Diagnosis Date  . Asthma   . Migraines   . Ovarian cyst, right     2 surgeries  . Pneumothorax    Past Surgical History  Procedure Laterality Date  . Ovarian cyst surgery  2006 & 2009   Family History  Problem Relation Age of Onset  . Anesthesia problems Neg Hx   . Hypotension Neg Hx   . Malignant hyperthermia Neg Hx   . Pseudochol deficiency Neg Hx    History  Substance Use Topics  . Smoking status: Never Smoker   . Smokeless tobacco: Not on file  . Alcohol Use: Yes     Comment: occasionally    OB History   Grav Para Term Preterm Abortions TAB SAB Ect Mult Living   0 0 0 0 0 0 0 0 0 0      Review of Systems  All other systems reviewed and are negative  Allergies  Chocolate and Morphine and related  Home Medications   Prior to Admission medications   Medication Sig Start Date End Date Taking? Authorizing Provider  albuterol (PROVENTIL HFA;VENTOLIN HFA) 108 (90 BASE) MCG/ACT inhaler Inhale 2 puffs into the lungs every 6 (six) hours as needed. For shortness of breath   Yes Historical Provider, MD  ipratropium-albuterol (DUONEB) 0.5-2.5 (3) MG/3ML SOLN Take 3 mLs by nebulization every 6 (six) hours as needed (shortness of breath).    Yes Historical Provider, MD  Prenatal Vit-Fe Fumarate-FA (PRENATAL MULTIVITAMIN) TABS tablet Take 1 tablet by mouth daily at 12 noon.   Yes  Historical Provider, MD   BP 126/84  Pulse 79  Temp(Src) 98.6 F (37 C) (Oral)  Resp 19  SpO2 99%  LMP 07/15/2013 Physical Exam  Nursing note and vitals reviewed. Constitutional: She is oriented to person, place, and time. She appears well-developed and well-nourished. No distress.  HENT:  Head: Normocephalic and atraumatic.  Eyes: Pupils are equal, round, and reactive to light.  Neck: Normal range of motion.  Cardiovascular: Normal rate and intact distal pulses.   Pulmonary/Chest: No respiratory distress. She has no wheezes. She has no rales.  Abdominal: Normal appearance. She exhibits no distension.  Musculoskeletal: Normal range of motion.  Neurological: She is alert and oriented to person, place, and time. No cranial nerve deficit.  Skin: Skin is warm and dry. No rash noted.  Psychiatric: She has a normal mood and affect. Her behavior is normal.    ED Course  Procedures (including critical care time) Labs Review Labs Reviewed - No data to display  Imaging Review Dg Chest 2 View  08/02/2013   CLINICAL DATA:  Chest pain and shortness of breath.  EXAM: CHEST  2 VIEW  COMPARISON:  03/20/2012  FINDINGS: The heart size and mediastinal contours are within normal limits. Both lungs are clear. The visualized  skeletal structures are unremarkable.  IMPRESSION: No active cardiopulmonary disease.   Electronically Signed   By: Elberta Fortisaniel  Boyle M.D.   On: 08/02/2013 19:26     Date: 08/02/2013  Rate: 75  Rhythm: normal sinus rhythm  QRS Axis: normal  Intervals: normal  ST/T Wave abnormalities: normal  Conduction Disutrbances: none  Narrative Interpretation: unremarkable      MDM   Final diagnoses:  Chest wall pain  Asthma        Nelia Shiobert L Baron Parmelee, MD 08/02/13 2019

## 2013-11-15 ENCOUNTER — Emergency Department (HOSPITAL_BASED_OUTPATIENT_CLINIC_OR_DEPARTMENT_OTHER)
Admission: EM | Admit: 2013-11-15 | Discharge: 2013-11-15 | Disposition: A | Discharge status: Home / Self Care | Payer: BC Managed Care – PPO | Attending: Emergency Medicine | Admitting: Emergency Medicine

## 2013-11-15 ENCOUNTER — Encounter (HOSPITAL_BASED_OUTPATIENT_CLINIC_OR_DEPARTMENT_OTHER): Payer: Self-pay | Admitting: Emergency Medicine

## 2013-11-15 DIAGNOSIS — Z8679 Personal history of other diseases of the circulatory system: Secondary | ICD-10-CM | POA: Diagnosis not present

## 2013-11-15 DIAGNOSIS — R062 Wheezing: Secondary | ICD-10-CM

## 2013-11-15 DIAGNOSIS — Z79899 Other long term (current) drug therapy: Secondary | ICD-10-CM | POA: Insufficient documentation

## 2013-11-15 DIAGNOSIS — Z8742 Personal history of other diseases of the female genital tract: Secondary | ICD-10-CM | POA: Diagnosis not present

## 2013-11-15 DIAGNOSIS — J45901 Unspecified asthma with (acute) exacerbation: Secondary | ICD-10-CM | POA: Diagnosis not present

## 2013-11-15 DIAGNOSIS — H9209 Otalgia, unspecified ear: Secondary | ICD-10-CM | POA: Diagnosis not present

## 2013-11-15 DIAGNOSIS — R059 Cough, unspecified: Secondary | ICD-10-CM | POA: Insufficient documentation

## 2013-11-15 DIAGNOSIS — R05 Cough: Secondary | ICD-10-CM | POA: Diagnosis present

## 2013-11-15 DIAGNOSIS — J069 Acute upper respiratory infection, unspecified: Secondary | ICD-10-CM

## 2013-11-15 DIAGNOSIS — B9789 Other viral agents as the cause of diseases classified elsewhere: Secondary | ICD-10-CM

## 2013-11-15 MED ORDER — PREDNISONE 50 MG PO TABS: ORAL_TABLET | ORAL | Status: DC

## 2013-11-15 MED ORDER — ALBUTEROL SULFATE HFA 108 (90 BASE) MCG/ACT IN AERS: 1.0000 | INHALATION_SPRAY | Freq: Four times a day (QID) | RESPIRATORY_TRACT | Status: DC | PRN

## 2013-11-15 MED ORDER — ALBUTEROL SULFATE (2.5 MG/3ML) 0.083% IN NEBU: 2.5000 mg | INHALATION_SOLUTION | RESPIRATORY_TRACT | Status: DC | PRN

## 2013-11-15 MED ORDER — PREDNISONE 50 MG PO TABS
60.0000 mg | ORAL_TABLET | Freq: Once | ORAL | Status: AC
  Administered 2013-11-15: 60 mg via ORAL
  Filled 2013-11-15 (×2): qty 1

## 2013-11-15 MED ORDER — ALBUTEROL SULFATE (2.5 MG/3ML) 0.083% IN NEBU
5.0000 mg | INHALATION_SOLUTION | Freq: Once | RESPIRATORY_TRACT | Status: AC
  Administered 2013-11-15: 5 mg via RESPIRATORY_TRACT
  Filled 2013-11-15: qty 6

## 2013-11-15 NOTE — Discharge Instructions (Signed)
Cough, Adult   A cough is a reflex. It helps you clear your throat and airways. A cough can help heal your body. A cough can last 2 or 3 weeks (acute) or may last more than 8 weeks (chronic). Some common causes of a cough can include an infection, allergy, or a cold.  HOME CARE  · Only take medicine as told by your doctor.  · If given, take your medicines (antibiotics) as told. Finish them even if you start to feel better.  · Use a cold steam vaporizer or humidifier in your home. This can help loosen thick spit (secretions).  · Sleep so you are almost sitting up (semi-upright). Use pillows to do this. This helps reduce coughing.  · Rest as needed.  · Stop smoking if you smoke.  GET HELP RIGHT AWAY IF:  · You have yellowish-white fluid (pus) in your thick spit.  · Your cough gets worse.  · Your medicine does not reduce coughing, and you are losing sleep.  · You cough up blood.  · You have trouble breathing.  · Your pain gets worse and medicine does not help.  · You have a fever.  MAKE SURE YOU:   · Understand these instructions.  · Will watch your condition.  · Will get help right away if you are not doing well or get worse.  Document Released: 11/19/2010 Document Revised: 07/23/2013 Document Reviewed: 11/19/2010  ExitCare® Patient Information ©2015 Exit[employer], LLC. This information is not intended to replace advice given to you by your health care provider. Make sure you discuss any questions you have with your health care provider.

## 2013-11-15 NOTE — ED Provider Notes (Signed)
CSN: [id number]     Arrival date & time 11/15/13  2023 History  This chart was scribed for Tanya Sheffield, MD by [place] Peace, ED Scribe. The patient was seen in [facility]. The patient's care was started at 10:01 PM.    Chief Complaint  Patient presents with  . Cough      Patient is a 33 y.o. female presenting with cough. The history is provided by the patient. No language interpreter was used.  Cough Severity:  Severe Duration:  5 days Progression:  Worsening Chronicity:  New Ineffective treatments:  Home nebulizer and cough suppressants Associated symptoms: ear pain, fever (max fever: 101.6), shortness of breath, sinus congestion and wheezing   Associated symptoms: no chest pain, no headaches, no rhinorrhea and no sore throat    HPI Comments: Tanya Holland is a 33 y.o. female who presents to the Emergency Department complaining of cough onset Saturday with associated ear pain, fever, and SOB. Pt reports that she received breathing treatment treatment since arriving here at ED which she reports has helped. Pt reports that she thought it was a sinus infection initially but her cough has gradually worsened which is why she came in. Pt states has nebulizer at home and has been treating herself with it since symptoms started.  Pt has history of asthma.  Past Medical History  Diagnosis Date  . Asthma   . Migraines   . Ovarian cyst, right     2 surgeries  . Pneumothorax    Past Surgical History  Procedure Laterality Date  . Ovarian cyst surgery  2006 & 2009   Family History  Problem Relation Age of Onset  . Anesthesia problems Neg Hx   . Hypotension Neg Hx   . Malignant hyperthermia Neg Hx   . Pseudochol deficiency Neg Hx    History  Substance Use Topics  . Smoking status: Never Smoker   . Smokeless tobacco: Not on file  . Alcohol Use: Yes     Comment: occasionally    OB History   Grav Para Term Preterm Abortions TAB SAB Ect Mult Living        Review of Systems  Constitutional: Positive for fever (max fever: 101.6). Negative for fatigue.  HENT: Positive for ear pain. Negative for congestion, drooling, rhinorrhea and sore throat.   Eyes: Negative for pain.  Respiratory: Positive for cough, shortness of breath and wheezing.   Cardiovascular: Negative for chest pain.  Gastrointestinal: Negative for nausea, vomiting, abdominal pain and diarrhea.  Genitourinary: Negative for dysuria and hematuria.  Musculoskeletal: Negative for neck pain.  Skin: Negative for color change.  Neurological: Negative for dizziness and headaches.  Hematological: Negative for adenopathy.  Psychiatric/Behavioral: Negative for behavioral problems.  All other systems reviewed and are negative.     Allergies  Chocolate and Morphine and related  Home Medications   Prior to Admission medications   Medication Sig Start Date End Date Taking? Authorizing Provider  albuterol (PROVENTIL HFA;VENTOLIN HFA) 108 (90 BASE) MCG/ACT inhaler Inhale 2 puffs into the lungs every 6 (six) hours as needed. For shortness of breath    Historical Provider, MD  ipratropium-albuterol (DUONEB) 0.5-2.5 (3) MG/3ML SOLN Take 3 mLs by nebulization every 6 (six) hours as needed (shortness of breath).     Historical Provider, MD  Prenatal Vit-Fe Fumarate-FA (PRENATAL MULTIVITAMIN) TABS tablet Take 1 tablet by mouth daily at 12 noon.    Historical Provider, MD   BP  152/88  Pulse 83  Temp(Src) 98 F (36.7 C) (Oral)  Resp 20  Ht  (1.448 m)  Wt 126 lb (57.153 kg)  BMI 27.26 kg/m2  SpO2 100%  LMP 10/09/2013 Physical Exam  Nursing note and vitals reviewed. Constitutional: She is oriented to person, place, and time. She appears well-developed and well-nourished. No distress.  HENT:  Head: Normocephalic and atraumatic.  Mouth/Throat: Oropharynx is clear and moist.  Eyes: Conjunctivae and EOM are normal. Pupils are equal, round, and reactive to light.  Neck: Normal range of  motion. Neck supple. No tracheal deviation present.  Cardiovascular: Normal rate, regular rhythm and normal heart sounds.  Exam reveals no gallop and no friction rub.   No murmur heard. Pulmonary/Chest: Effort normal and breath sounds normal. No respiratory distress. She has no wheezes. She has no rales.  Abdominal: Soft. Bowel sounds are normal. She exhibits no distension. There is no tenderness. There is no rebound and no guarding.  Musculoskeletal: Normal range of motion. She exhibits no edema and no tenderness.  Neurological: She is alert and oriented to person, place, and time.  Skin: Skin is warm and dry.  Psychiatric: She has a normal mood and affect. Her behavior is normal.    ED Course  Procedures (including critical care time) Labs Review Labs Reviewed - No data to display  Results for orders placed during the hospital encounter of 01/05/13  CULTURE, GROUP A STREP      Result Value Ref Range   Specimen Description THROAT     Special Requests NONE     Culture       Value: No Beta Hemolytic Streptococci Isolated     Performed at [facility]   Report Status 01/07/2013 FINAL    POCT INFECTIOUS MONO SCREEN      Result Value Ref Range   Mono Screen NEGATIVE  NEGATIVE   No results found.    Imaging Review No results found.   EKG Interpretation None     Medications  albuterol (PROVENTIL) (2.5 MG/3ML) 0.083% nebulizer solution 5 mg (5 mg Nebulization Given 11/15/13 2034)   10:07 PM- Treatment plan was discussed with patient who verbalizes understanding and agrees.   MDM   Final diagnoses:  Viral URI with cough  Wheezing    9:48 AM 33 y.o. female here w/ cough. Hx of asthma. No wheezing on my exam, but got breathing tx prior to my eval. VS unremarkable. Will give Rx for prednisone. Doubt pna. Return for any worsening.  Medications given during this visit Medications  albuterol (PROVENTIL) (2.5 MG/3ML) 0.083% nebulizer solution 5 mg (5 mg Nebulization  Given 11/15/13 2034)  predniSONE (DELTASONE) tablet 60 mg (60 mg Oral Given 11/15/13 2216)    Discharge Medication List as of 11/15/2013 10:08 PM    START taking these medications   Details  !! albuterol (PROVENTIL HFA;VENTOLIN HFA) 108 (90 BASE) MCG/ACT inhaler Inhale 1-2 puffs into the lungs every 6 (six) hours as needed for wheezing or shortness of breath., Starting 11/15/2013, Until Discontinued, Print    albuterol (PROVENTIL) (2.5 MG/3ML) 0.083% nebulizer solution Take 3 mLs (2.5 mg total) by nebulization every 4 (four) hours as needed for wheezing or shortness of breath., Starting 11/15/2013, Until Discontinued, Print    predniSONE (DELTASONE) 50 MG tablet Take 1 tablet daily for the next 4 days., Print     !! - Potential duplicate medications found. Please discuss with provider.        I personally performed the  services described in this documentation, which was scribed in my presence. The recorded information has been reviewed and is accurate.    Tanya Sheffield, MD 11/16/13 [phone]

## 2013-11-15 NOTE — ED Notes (Signed)
Cough since Saturday. Hx of asthma.

## 2014-02-26 ENCOUNTER — Encounter (HOSPITAL_COMMUNITY): Payer: Self-pay | Admitting: Emergency Medicine

## 2014-02-26 ENCOUNTER — Emergency Department (HOSPITAL_COMMUNITY)
Admission: EM | Admit: 2014-02-26 | Discharge: 2014-02-27 | Disposition: A | Discharge status: Home / Self Care | Payer: BC Managed Care – PPO | Attending: Emergency Medicine | Admitting: Emergency Medicine

## 2014-02-26 ENCOUNTER — Emergency Department (HOSPITAL_COMMUNITY): Payer: BC Managed Care – PPO

## 2014-02-26 DIAGNOSIS — M62830 Muscle spasm of back: Secondary | ICD-10-CM | POA: Diagnosis not present

## 2014-02-26 DIAGNOSIS — M62838 Other muscle spasm: Secondary | ICD-10-CM

## 2014-02-26 DIAGNOSIS — M546 Pain in thoracic spine: Secondary | ICD-10-CM | POA: Diagnosis not present

## 2014-02-26 DIAGNOSIS — R0602 Shortness of breath: Secondary | ICD-10-CM | POA: Diagnosis present

## 2014-02-26 DIAGNOSIS — R079 Chest pain, unspecified: Secondary | ICD-10-CM | POA: Insufficient documentation

## 2014-02-26 DIAGNOSIS — R000 Tachycardia, unspecified: Secondary | ICD-10-CM | POA: Insufficient documentation

## 2014-02-26 DIAGNOSIS — J45901 Unspecified asthma with (acute) exacerbation: Secondary | ICD-10-CM | POA: Insufficient documentation

## 2014-02-26 DIAGNOSIS — J939 Pneumothorax, unspecified: Secondary | ICD-10-CM | POA: Insufficient documentation

## 2014-02-26 DIAGNOSIS — G43909 Migraine, unspecified, not intractable, without status migrainosus: Secondary | ICD-10-CM | POA: Insufficient documentation

## 2014-02-26 DIAGNOSIS — R110 Nausea: Secondary | ICD-10-CM | POA: Diagnosis not present

## 2014-02-26 DIAGNOSIS — Z79899 Other long term (current) drug therapy: Secondary | ICD-10-CM | POA: Insufficient documentation

## 2014-02-26 DIAGNOSIS — Z8742 Personal history of other diseases of the female genital tract: Secondary | ICD-10-CM | POA: Diagnosis not present

## 2014-02-26 LAB — CBC WITH DIFFERENTIAL/PLATELET
Basophils Absolute: 0 10*3/uL (ref 0.0–0.1)
Basophils Relative: 0 % (ref 0–1)
EOS ABS: 0.1 10*3/uL (ref 0.0–0.7)
Eosinophils Relative: 1 % (ref 0–5)
HCT: 36.2 % (ref 36.0–46.0)
Hemoglobin: 12 g/dL (ref 12.0–15.0)
LYMPHS ABS: 2.3 10*3/uL (ref 0.7–4.0)
LYMPHS PCT: 21 % (ref 12–46)
MCH: 27.3 pg (ref 26.0–34.0)
MCHC: 33.1 g/dL (ref 30.0–36.0)
MCV: 82.5 fL (ref 78.0–100.0)
MONOS PCT: 6 % (ref 3–12)
Monocytes Absolute: 0.7 10*3/uL (ref 0.1–1.0)
Neutro Abs: 8.2 10*3/uL — ABNORMAL HIGH (ref 1.7–7.7)
Neutrophils Relative %: 72 % (ref 43–77)
Platelets: 344 10*3/uL (ref 150–400)
RBC: 4.39 MIL/uL (ref 3.87–5.11)
RDW: 13.6 % (ref 11.5–15.5)
WBC: 11.2 10*3/uL — ABNORMAL HIGH (ref 4.0–10.5)

## 2014-02-26 LAB — I-STAT CHEM 8, ED
BUN: 8 mg/dL (ref 6–23)
Calcium, Ion: 1.12 mmol/L (ref 1.12–1.23)
Chloride: 102 mEq/L (ref 96–112)
Creatinine, Ser: 1 mg/dL (ref 0.50–1.10)
GLUCOSE: 123 mg/dL — AB (ref 70–99)
HCT: 40 % (ref 36.0–46.0)
HEMOGLOBIN: 13.6 g/dL (ref 12.0–15.0)
POTASSIUM: 3.2 meq/L — AB (ref 3.7–5.3)
Sodium: 140 mEq/L (ref 137–147)
TCO2: 21 mmol/L (ref 0–100)

## 2014-02-26 MED ORDER — HYDROMORPHONE HCL 1 MG/ML IJ SOLN
1.0000 mg | Freq: Once | INTRAMUSCULAR | Status: AC
  Administered 2014-02-26: 1 mg via INTRAVENOUS
  Filled 2014-02-26: qty 1

## 2014-02-26 MED ORDER — ONDANSETRON HCL 4 MG/2ML IJ SOLN
4.0000 mg | Freq: Once | INTRAMUSCULAR | Status: AC
  Administered 2014-02-26: 4 mg via INTRAVENOUS
  Filled 2014-02-26: qty 2

## 2014-02-26 NOTE — ED Provider Notes (Signed)
CSN: [id number]     Arrival date & time 02/26/14  2154 History   First MD Initiated Contact with Patient 02/26/14 2236     Chief Complaint  Patient presents with  . Chest Pain  . Shortness of Breath     (Consider location/radiation/quality/duration/timing/severity/associated sxs/prior Treatment) HPI Comments: Patient states at work when she developed SOB took albuterol treatment than developed L posterior chest "spasm"    States has had this in the past   Patient is a 33 y.o. female presenting with chest pain and shortness of breath. The history is provided by the patient.  Chest Pain Pain location:  L chest Pain quality: aching   Pain quality comment:  Cramping Pain radiates to:  Does not radiate Pain severity:  Severe Onset quality:  Gradual Duration:  1 hour Timing:  Constant Progression:  Unchanged Chronicity:  Recurrent Context: not lifting, no movement and not raising an arm   Relieved by:  Nothing Worsened by:  Nothing tried Ineffective treatments:  None tried Associated symptoms: back pain and shortness of breath   Associated symptoms: no cough, no diaphoresis, no fever, no heartburn and no lower extremity edema   Risk factors: not obese, no prior DVT/PE and no smoking   Shortness of Breath Associated symptoms: chest pain   Associated symptoms: no cough, no diaphoresis, no fever and no wheezing     Past Medical History  Diagnosis Date  . Asthma   . Migraines   . Ovarian cyst, right     2 surgeries  . Pneumothorax    Past Surgical History  Procedure Laterality Date  . Ovarian cyst surgery  2006 & 2009   Family History  Problem Relation Age of Onset  . Anesthesia problems Neg Hx   . Hypotension Neg Hx   . Malignant hyperthermia Neg Hx   . Pseudochol deficiency Neg Hx    History  Substance Use Topics  . Smoking status: Never Smoker   . Smokeless tobacco: Never Used  . Alcohol Use: Yes     Comment: occasionally    OB History    Gravida Para Term  Preterm AB TAB SAB Ectopic Multiple Living   0 0 0 0 0 0 0 0 0 0      Review of Systems  Constitutional: Negative for fever and diaphoresis.  Respiratory: Positive for shortness of breath. Negative for cough, chest tightness and wheezing.   Cardiovascular: Positive for chest pain.  Gastrointestinal: Negative for heartburn.  Musculoskeletal: Positive for back pain.      Allergies  Chocolate and Morphine and related  Home Medications   Prior to Admission medications   Medication Sig Start Date End Date Taking? Authorizing Provider  albuterol (PROVENTIL HFA;VENTOLIN HFA) 108 (90 BASE) MCG/ACT inhaler Inhale 2 puffs into the lungs every 6 (six) hours as needed. For shortness of breath   Yes Historical Provider, MD  albuterol (PROVENTIL) (2.5 MG/3ML) 0.083% nebulizer solution Take 3 mLs (2.5 mg total) by nebulization every 4 (four) hours as needed for wheezing or shortness of breath. 11/15/13  Yes Purvis SheffieldForrest Harrison, MD  ipratropium-albuterol (DUONEB) 0.5-2.5 (3) MG/3ML SOLN Take 3 mLs by nebulization every 6 (six) hours as needed (shortness of breath).    Yes Historical Provider, MD  predniSONE (DELTASONE) 50 MG tablet Take 1 tablet daily for the next 4 days. 11/15/13  Yes Purvis SheffieldForrest Harrison, MD  cyclobenzaprine (FLEXERIL) 5 MG tablet Take 1 tablet (5 mg total) by mouth 3 (three) times daily as needed for muscle spasms.  02/27/14   Arman FilterGail K Cyann Venti, NP  HYDROcodone-acetaminophen (NORCO/VICODIN) 5-325 MG per tablet Take 1 tablet by mouth every 6 (six) hours as needed for moderate pain. 02/27/14   Arman FilterGail K Axell Trigueros, NP  ondansetron (ZOFRAN ODT) 4 MG disintegrating tablet Take 1 tablet (4 mg total) by mouth every 8 (eight) hours as needed for nausea or vomiting. 02/27/14   Arman FilterGail K Elliott Lasecki, NP   BP 103/63 mmHg  Pulse 96  Temp(Src) 99 F (37.2 C) (Oral)  Resp 15  Ht 4\' 10"  (1.473 m)  Wt 123 lb (55.792 kg)  BMI 25.71 kg/m2  SpO2 95%  LMP 11/28/2013 Physical Exam  Constitutional: She is oriented to person,  place, and time. She appears well-developed and well-nourished.  HENT:  Head: Normocephalic.  Eyes: Pupils are equal, round, and reactive to light.  Neck: Normal range of motion.  Cardiovascular: Regular rhythm.  Tachycardia present.   Pulmonary/Chest: Effort normal and breath sounds normal. No respiratory distress. She has no wheezes. She has no rales.   She exhibits tenderness.  Abdominal: Soft.  Musculoskeletal: Normal range of motion.  Neurological: She is alert and oriented to person, place, and time.  Skin: Skin is warm. No erythema.  Nursing note and vitals reviewed.   ED Course  Procedures (including critical care time) Labs Review Labs Reviewed  CBC WITH DIFFERENTIAL - Abnormal; Notable for the following:    WBC 11.2 (*)    Neutro Abs 8.2 (*)    All other components within normal limits  I-STAT CHEM 8, ED - Abnormal; Notable for the following:    Potassium 3.2 (*)    Glucose, Bld 123 (*)    All other components within normal limits  LIPASE, BLOOD  D-DIMER, QUANTITATIVE    Imaging Review Dg Chest Portable 1 View  02/26/2014   CLINICAL DATA:  Pneumothorax, LEFT lateral chest pain, shortness of breath  EXAM: PORTABLE CHEST - 1 VIEW  COMPARISON:  Portable exam 2252 hr compared to 08/02/2013  FINDINGS: Borderline enlargement of cardiac silhouette.  Mediastinal contours and pulmonary vascularity normal.  Lungs clear.  No pleural effusion or pneumothorax.  Bones unremarkable.  IMPRESSION: No acute abnormalities.   Electronically Signed   By: Ulyses SouthwardMark  Boles M.D.   On: 02/26/2014 23:12     EKG Interpretation None     Since labs, x-rays, all within normal limits.  She is responding beautifully to the pain medication and is longer.  She is quiet.  She has no pain.  This initially started walking around or moving.  She again develops muscle spasm.  She'll be sent home with prescription for Flexeril, Vicodin and Zofran to help control her symptoms.  She is to follow-up with her  primary care physician by phone today MDM  Chest x ray normal  Muscle spasm nausea       Arman FilterGail K Tenasia Aull, NP 02/27/14 [id number]  Warnell Foresterrey Wofford, MD 03/02/14 1459

## 2014-02-26 NOTE — ED Notes (Signed)
Pt brought to ED by [facility] from work for c/o 9/10 chest pain and SOB. Pt were feeling SOB at work and dizzy and she tock 5 of albuterol at work with no relief. Pt is 98 -100% on RA.  VSS on arrival HR 112, BP 137/97. Pt has Hx of asthma.

## 2014-02-27 LAB — LIPASE, BLOOD: Lipase: 26 U/L (ref 11–59)

## 2014-02-27 LAB — D-DIMER, QUANTITATIVE: D-Dimer, Quant: <0.27 ug/mL-FEU (ref 0.00–0.48)

## 2014-02-27 MED ORDER — HYDROCODONE-ACETAMINOPHEN 5-325 MG PO TABS
1.0000 | ORAL_TABLET | Freq: Once | ORAL | Status: AC
  Administered 2014-02-27: 1 via ORAL
  Filled 2014-02-27: qty 1

## 2014-02-27 MED ORDER — ONDANSETRON 4 MG PO TBDP: 4.0000 mg | ORAL_TABLET | Freq: Three times a day (TID) | ORAL | Status: DC | PRN

## 2014-02-27 MED ORDER — ONDANSETRON HCL 4 MG/2ML IJ SOLN
4.0000 mg | Freq: Once | INTRAMUSCULAR | Status: AC
  Administered 2014-02-27: 4 mg via INTRAVENOUS

## 2014-02-27 MED ORDER — SODIUM CHLORIDE 0.9 % IV BOLUS (SEPSIS)
1000.0000 mL | Freq: Once | INTRAVENOUS | Status: AC
  Administered 2014-02-27: 1000 mL via INTRAVENOUS

## 2014-02-27 MED ORDER — HYDROCODONE-ACETAMINOPHEN 5-325 MG PO TABS
1.0000 | ORAL_TABLET | Freq: Four times a day (QID) | ORAL | Status: DC | PRN
Start: 2014-02-27 — End: 2014-11-30

## 2014-02-27 MED ORDER — CYCLOBENZAPRINE HCL 5 MG PO TABS: 5.0000 mg | ORAL_TABLET | Freq: Three times a day (TID) | ORAL | Status: DC | PRN

## 2014-02-27 MED ORDER — HYDROMORPHONE HCL 1 MG/ML IJ SOLN
1.0000 mg | Freq: Once | INTRAMUSCULAR | Status: AC
  Administered 2014-02-27: 1 mg via INTRAVENOUS
  Filled 2014-02-27: qty 1

## 2014-02-27 MED ORDER — POTASSIUM CHLORIDE CRYS ER 20 MEQ PO TBCR
20.0000 meq | EXTENDED_RELEASE_TABLET | Freq: Once | ORAL | Status: AC
  Administered 2014-02-27: 20 meq via ORAL
  Filled 2014-02-27: qty 1

## 2014-02-27 NOTE — ED Notes (Signed)
Pt states she is very nauseated and pain is back after walking to bathroom.

## 2014-02-27 NOTE — ED Notes (Signed)
Pt ambulated to bathroom with the nurse tech after walking pt got sick, nauseated and started vomiting.

## 2014-11-30 ENCOUNTER — Inpatient Hospital Stay (HOSPITAL_COMMUNITY): Payer: Self-pay

## 2014-11-30 ENCOUNTER — Encounter (HOSPITAL_COMMUNITY): Payer: Self-pay | Admitting: *Deleted

## 2014-11-30 ENCOUNTER — Inpatient Hospital Stay (HOSPITAL_COMMUNITY)
Admission: AD | Admit: 2014-11-30 | Discharge: 2014-11-30 | Disposition: A | Discharge status: Home / Self Care | Payer: Self-pay | Source: Ambulatory Visit | Attending: Obstetrics & Gynecology | Admitting: Obstetrics & Gynecology

## 2014-11-30 DIAGNOSIS — N83201 Unspecified ovarian cyst, right side: Secondary | ICD-10-CM

## 2014-11-30 DIAGNOSIS — R109 Unspecified abdominal pain: Secondary | ICD-10-CM | POA: Insufficient documentation

## 2014-11-30 DIAGNOSIS — N83202 Unspecified ovarian cyst, left side: Secondary | ICD-10-CM

## 2014-11-30 DIAGNOSIS — N8320 Unspecified ovarian cysts: Secondary | ICD-10-CM | POA: Insufficient documentation

## 2014-11-30 LAB — COMPREHENSIVE METABOLIC PANEL
ALT: 6 U/L — ABNORMAL LOW (ref 14–54)
ANION GAP: 8 (ref 5–15)
AST: 13 U/L — ABNORMAL LOW (ref 15–41)
Albumin: 3.9 g/dL (ref 3.5–5.0)
Alkaline Phosphatase: 54 U/L (ref 38–126)
BILIRUBIN TOTAL: 0.8 mg/dL (ref 0.3–1.2)
BUN: 13 mg/dL (ref 6–20)
CO2: 26 mmol/L (ref 22–32)
Calcium: 9.1 mg/dL (ref 8.9–10.3)
Chloride: 103 mmol/L (ref 101–111)
Creatinine, Ser: 0.7 mg/dL (ref 0.44–1.00)
GFR calc Af Amer: >60 mL/min (ref >60)
GFR calc non Af Amer: >60 mL/min (ref >60)
GLUCOSE: 91 mg/dL (ref 65–99)
POTASSIUM: 4.3 mmol/L (ref 3.5–5.1)
SODIUM: 137 mmol/L (ref 135–145)
TOTAL PROTEIN: 6.9 g/dL (ref 6.5–8.1)

## 2014-11-30 LAB — URINALYSIS, ROUTINE W REFLEX MICROSCOPIC
Bilirubin Urine: NEGATIVE
Glucose, UA: NEGATIVE mg/dL
Ketones, ur: NEGATIVE mg/dL
LEUKOCYTES UA: NEGATIVE
Nitrite: NEGATIVE
PH: 6 (ref 5.0–8.0)
Protein, ur: NEGATIVE mg/dL
SPECIFIC GRAVITY, URINE: 1.02 (ref 1.005–1.030)
Urobilinogen, UA: 0.2 mg/dL (ref 0.0–1.0)

## 2014-11-30 LAB — POCT PREGNANCY, URINE: Preg Test, Ur: NEGATIVE

## 2014-11-30 LAB — CBC
HCT: 35.5 % — ABNORMAL LOW (ref 36.0–46.0)
HEMOGLOBIN: 12 g/dL (ref 12.0–15.0)
MCH: 28.4 pg (ref 26.0–34.0)
MCHC: 33.8 g/dL (ref 30.0–36.0)
MCV: 84.1 fL (ref 78.0–100.0)
Platelets: 341 10*3/uL (ref 150–400)
RBC: 4.22 MIL/uL (ref 3.87–5.11)
RDW: 14.1 % (ref 11.5–15.5)
WBC: 10.3 10*3/uL (ref 4.0–10.5)

## 2014-11-30 LAB — WET PREP, GENITAL
CLUE CELLS WET PREP: NONE SEEN
TRICH WET PREP: NONE SEEN
WBC, Wet Prep HPF POC: NONE SEEN
Yeast Wet Prep HPF POC: NONE SEEN

## 2014-11-30 LAB — URINE MICROSCOPIC-ADD ON

## 2014-11-30 MED ORDER — OXYCODONE-ACETAMINOPHEN 5-325 MG PO TABS
1.0000 | ORAL_TABLET | ORAL | Status: AC
  Administered 2014-11-30: 1 via ORAL
  Filled 2014-11-30: qty 1

## 2014-11-30 MED ORDER — KETOROLAC TROMETHAMINE 60 MG/2ML IM SOLN
60.0000 mg | Freq: Once | INTRAMUSCULAR | Status: AC
  Administered 2014-11-30: 60 mg via INTRAMUSCULAR
  Filled 2014-11-30: qty 2

## 2014-11-30 MED ORDER — TRAMADOL HCL 50 MG PO TABS
50.0000 mg | ORAL_TABLET | Freq: Once | ORAL | Status: AC
Start: 2014-11-30 — End: 2014-11-30
  Administered 2014-11-30: 50 mg via ORAL
  Filled 2014-11-30: qty 1

## 2014-11-30 MED ORDER — IBUPROFEN 800 MG PO TABS: 800.0000 mg | ORAL_TABLET | Freq: Three times a day (TID) | ORAL | Status: DC | PRN

## 2014-11-30 MED ORDER — HYDROCODONE-ACETAMINOPHEN 5-325 MG PO TABS: 1.0000 | ORAL_TABLET | Freq: Four times a day (QID) | ORAL | Status: DC | PRN

## 2014-11-30 NOTE — Progress Notes (Signed)
  Called about patient management.  Per office notes: seen one time in Jan 2015 for follow-up from ED visit for ovarian cyst and pain - scheduled for surgery in February but never returned to office  - discharged from the practice for non-payment / non-compliance.  She is not a patient of [facility].   Marlinda Mike CNM [facility]

## 2014-11-30 NOTE — MAU Provider Note (Signed)
History     CSN: [id number]  Arrival date and time: 11/30/14 1441   First Provider Initiated Contact with Patient 11/30/14 1543      Chief Complaint  Patient presents with  . Abdominal Pain   HPI   Ms.Tanya Holland is a 34 y.o. female [id number] presenting to MAU with complaints of right sided abdominal pain. She has had 2 cysts removed from her Right ovary in the past; her last surgery was in 2012. This pain feels similar to that pain when she had a cyst. She currently rates her pain 9/10. Denies bleeding or discharge.   She was scheduled to have another cyst removed with [facility] and did not follow through with their plan. She was dismissed from the practice.     Past Medical History  Diagnosis Date  . Asthma   . Migraines   . Ovarian cyst, right     2 surgeries  . Pneumothorax     Past Surgical History  Procedure Laterality Date  . Ovarian cyst surgery  2006 & 2009    Family History  Problem Relation Age of Onset  . Anesthesia problems Neg Hx   . Hypotension Neg Hx   . Malignant hyperthermia Neg Hx   . Pseudochol deficiency Neg Hx     Social History  Substance Use Topics  . Smoking status: Never Smoker   . Smokeless tobacco: Never Used  . Alcohol Use: Yes     Comment: occasionally     Allergies:  Allergies  Allergen Reactions  . Chocolate Hives, Itching and Swelling    Tongue swells  . Morphine And Related Rash    Hallucinations    Prescriptions prior to admission  Medication Sig Dispense Refill Last Dose  . albuterol (PROVENTIL HFA;VENTOLIN HFA) 108 (90 BASE) MCG/ACT inhaler Inhale 2 puffs into the lungs every 6 (six) hours as needed. For shortness of breath   11/29/2014 at Unknown time  . albuterol (PROVENTIL) (2.5 MG/3ML) 0.083% nebulizer solution Take 3 mLs (2.5 mg total) by nebulization every 4 (four) hours as needed for wheezing or shortness of breath. 30 vial 0 Past Week at Unknown time  . cyclobenzaprine (FLEXERIL) 5 MG tablet Take 1  tablet (5 mg total) by mouth 3 (three) times daily as needed for muscle spasms. (Patient not taking: Reported on 11/30/2014) 30 tablet 0   . HYDROcodone-acetaminophen (NORCO/VICODIN) 5-325 MG per tablet Take 1 tablet by mouth every 6 (six) hours as needed for moderate pain. (Patient not taking: Reported on 11/30/2014) 12 tablet 0   . ondansetron (ZOFRAN ODT) 4 MG disintegrating tablet Take 1 tablet (4 mg total) by mouth every 8 (eight) hours as needed for nausea or vomiting. (Patient not taking: Reported on 11/30/2014) 20 tablet 0   . predniSONE (DELTASONE) 50 MG tablet Take 1 tablet daily for the next 4 days. (Patient not taking: Reported on 11/30/2014) 4 tablet 0 02/26/2014 at Unknown time   Results for orders placed or performed during the hospital encounter of 11/30/14 (from the past 48 hour(s))  Urinalysis, Routine w reflex microscopic (not at [facility])     Status: Abnormal   Collection Time: 11/30/14  2:59 PM  Result Value Ref Range   Color, Urine YELLOW YELLOW   APPearance CLEAR CLEAR   Specific Gravity, Urine 1.020 1.005 - 1.030   pH 6.0 5.0 - 8.0   Glucose, UA NEGATIVE NEGATIVE mg/dL   Hgb urine dipstick TRACE (A) NEGATIVE   Bilirubin Urine NEGATIVE NEGATIVE   Ketones,  ur NEGATIVE NEGATIVE mg/dL   Protein, ur NEGATIVE NEGATIVE mg/dL   Urobilinogen, UA 0.2 0.0 - 1.0 mg/dL   Nitrite NEGATIVE NEGATIVE   Leukocytes, UA NEGATIVE NEGATIVE  Urine microscopic-add on     Status: None   Collection Time: 11/30/14  2:59 PM  Result Value Ref Range   Squamous Epithelial / LPF RARE RARE   RBC / HPF 0-2 <3 RBC/hpf  Pregnancy, urine POC     Status: None   Collection Time: 11/30/14  3:21 PM  Result Value Ref Range   Preg Test, Ur NEGATIVE NEGATIVE    Comment:        THE SENSITIVITY OF THIS METHODOLOGY IS >24 mIU/mL   CBC     Status: Abnormal   Collection Time: 11/30/14  5:15 PM  Result Value Ref Range   WBC 10.3 4.0 - 10.5 K/uL   RBC 4.22 3.87 - 5.11 MIL/uL   Hemoglobin 12.0 12.0 - 15.0 g/dL    HCT 35.5 (L) 36.0 - 46.0 %   MCV 84.1 78.0 - 100.0 fL   MCH 28.4 26.0 - 34.0 pg   MCHC 33.8 30.0 - 36.0 g/dL   RDW 14.1 11.5 - 15.5 %   Platelets 341 150 - 400 K/uL  Wet prep, genital     Status: None   Collection Time: 11/30/14  5:50 PM  Result Value Ref Range   Yeast Wet Prep HPF POC NONE SEEN NONE SEEN   Trich, Wet Prep NONE SEEN NONE SEEN   Clue Cells Wet Prep HPF POC NONE SEEN NONE SEEN   WBC, Wet Prep HPF POC NONE SEEN NONE SEEN  Comprehensive metabolic panel     Status: Abnormal   Collection Time: 11/30/14  6:00 PM  Result Value Ref Range   Sodium 137 135 - 145 mmol/L   Potassium 4.3 3.5 - 5.1 mmol/L   Chloride 103 101 - 111 mmol/L   CO2 26 22 - 32 mmol/L   Glucose, Bld 91 65 - 99 mg/dL   BUN 13 6 - 20 mg/dL   Creatinine, Ser 0.70 0.44 - 1.00 mg/dL   Calcium 9.1 8.9 - 10.3 mg/dL   Total Protein 6.9 6.5 - 8.1 g/dL   Albumin 3.9 3.5 - 5.0 g/dL   AST 13 (L) 15 - 41 U/L   ALT 6 (L) 14 - 54 U/L   Alkaline Phosphatase 54 38 - 126 U/L   Total Bilirubin 0.8 0.3 - 1.2 mg/dL   GFR calc non Af Amer >60 >60 mL/min   GFR calc Af Amer >60 >60 mL/min    Comment: (NOTE) The eGFR has been calculated using the CKD EPI equation. This calculation has not been validated in all clinical situations. eGFR's persistently <60 mL/min signify possible Chronic Kidney Disease.    Anion gap 8 5 - 15    Review of Systems  Constitutional: Negative for fever.  Gastrointestinal: Positive for abdominal pain. Negative for nausea, vomiting, diarrhea and constipation.  Genitourinary: Negative for dysuria.       Denies vaginal discharge   Musculoskeletal: Negative for back pain.   Physical Exam   Blood pressure 112/81, pulse 73, temperature 98.1 F (36.7 C), temperature source Oral, resp. rate 18, height 4' 10"  (1.473 m), weight 54.432 kg (120 lb), last menstrual period 11/10/2014.  Physical Exam  Constitutional: She is oriented to person, place, and time. She appears well-developed and  well-nourished.  Non-toxic appearance. She does not have a sickly appearance. She does not appear ill.  No distress.  HENT:  Head: Normocephalic.  Eyes: Pupils are equal, round, and reactive to light.  Neck: Neck supple.  GI: Soft. Normal appearance. There is generalized tenderness. There is rigidity. There is no guarding.  Genitourinary:  Bimanual exam: Cervix closed Uterus non tender, normal size Bilateral Adnexal tenderness, no masses bilaterally, + suprapubic tenderness   Wet prep done Chaperone present for exam.  Musculoskeletal: Normal range of motion.  Neurological: She is alert and oriented to person, place, and time.  Skin: Skin is warm. She is not diaphoretic.  Psychiatric: Her behavior is normal.    MAU Course  Procedures  MDM  Toradol 60 mg IM CBC   No relief from toradol patient rates her pain 7/10 Pelvic US ordered   Percocet 2 mg given per patient requests. Waiting for [place] results Report given to Allie Dimmer [facility] who resumes care.   Lezlie Lye, NP Allergy to codeine noted (rash) - ultram given in place of percocet.   Results for orders placed or performed during the hospital encounter of 11/30/14 (from the past 24 hour(s))  Urinalysis, Routine w reflex microscopic (not at [facility])     Status: Abnormal   Collection Time: 11/30/14  2:59 PM  Result Value Ref Range   Color, Urine YELLOW YELLOW   APPearance CLEAR CLEAR   Specific Gravity, Urine 1.020 1.005 - 1.030   pH 6.0 5.0 - 8.0   Glucose, UA NEGATIVE NEGATIVE mg/dL   Hgb urine dipstick TRACE (A) NEGATIVE   Bilirubin Urine NEGATIVE NEGATIVE   Ketones, ur NEGATIVE NEGATIVE mg/dL   Protein, ur NEGATIVE NEGATIVE mg/dL   Urobilinogen, UA 0.2 0.0 - 1.0 mg/dL   Nitrite NEGATIVE NEGATIVE   Leukocytes, UA NEGATIVE NEGATIVE  Urine microscopic-add on     Status: None   Collection Time: 11/30/14  2:59 PM  Result Value Ref Range   Squamous Epithelial / LPF RARE RARE   RBC / HPF 0-2 <3 RBC/hpf   Pregnancy, urine POC     Status: None   Collection Time: 11/30/14  3:21 PM  Result Value Ref Range   Preg Test, Ur NEGATIVE NEGATIVE  CBC     Status: Abnormal   Collection Time: 11/30/14  5:15 PM  Result Value Ref Range   WBC 10.3 4.0 - 10.5 K/uL   RBC 4.22 3.87 - 5.11 MIL/uL   Hemoglobin 12.0 12.0 - 15.0 g/dL   HCT 35.5 (L) 36.0 - 46.0 %   MCV 84.1 78.0 - 100.0 fL   MCH 28.4 26.0 - 34.0 pg   MCHC 33.8 30.0 - 36.0 g/dL   RDW 14.1 11.5 - 15.5 %   Platelets 341 150 - 400 K/uL  Wet prep, genital     Status: None   Collection Time: 11/30/14  5:50 PM  Result Value Ref Range   Yeast Wet Prep HPF POC NONE SEEN NONE SEEN   Trich, Wet Prep NONE SEEN NONE SEEN   Clue Cells Wet Prep HPF POC NONE SEEN NONE SEEN   WBC, Wet Prep HPF POC NONE SEEN NONE SEEN  Comprehensive metabolic panel     Status: Abnormal   Collection Time: 11/30/14  6:00 PM  Result Value Ref Range   Sodium 137 135 - 145 mmol/L   Potassium 4.3 3.5 - 5.1 mmol/L   Chloride 103 101 - 111 mmol/L   CO2 26 22 - 32 mmol/L   Glucose, Bld 91 65 - 99 mg/dL   BUN 13 6 - 20 mg/dL  Creatinine, Ser 0.70 0.44 - 1.00 mg/dL   Calcium 9.1 8.9 - 10.3 mg/dL   Total Protein 6.9 6.5 - 8.1 g/dL   Albumin 3.9 3.5 - 5.0 g/dL   AST 13 (L) 15 - 41 U/L   ALT 6 (L) 14 - 54 U/L   Alkaline Phosphatase 54 38 - 126 U/L   Total Bilirubin 0.8 0.3 - 1.2 mg/dL   GFR calc non Af Amer >60 >60 mL/min   GFR calc Af Amer >60 >60 mL/min   Anion gap 8 5 - 15  US Transvaginal Non-ob  11/30/2014   CLINICAL DATA:  Abdominal pain.  History of right ovarian cyst.  EXAM: TRANSABDOMINAL AND TRANSVAGINAL ULTRASOUND OF PELVIS  DOPPLER ULTRASOUND OF OVARIES  TECHNIQUE: Both transabdominal and transvaginal ultrasound examinations of the pelvis were performed. Transabdominal technique was performed for global imaging of the pelvis including uterus, ovaries, adnexal regions, and pelvic cul-de-sac.  It was necessary to proceed with endovaginal exam following the  transabdominal exam to visualize the ovaries. Color and duplex Doppler ultrasound was utilized to evaluate blood flow to the ovaries.  COMPARISON:  CT abdomen and pelvis 01/04/2012. Pelvic ultrasound 04/07/2011.  FINDINGS: Uterus  Measurements: 8.0 x 3.6 x 3.7 cm. No fibroids or other mass visualized.  Endometrium  Thickness: 6.5 mm.  No focal abnormality visualized.  Right ovary  Measurements: 6.4 x 3.0 x 3.3 cm. A simple appearing cyst measures 3.7 x 2.7 x 3.5 cm, mildly larger than the 2.9 cm cyst on the 2013 study but without evidence of solid component. This could represent a new cyst versus mild enlargement of the pre-existing cyst. A separate, smaller cyst measures 2.2 x 1.3 x 1.8 cm.  Left ovary  Measurements: 8.2 x 6.3 x 7.1 cm. 7.8 x 5.8 x 6.9 cm hypoechoic lesion/cyst with diffuse low level internal echoes/ debris and no evidence of internal vascularity.  Pulsed Doppler evaluation of both ovaries demonstrates normal low-resistance arterial and venous waveforms.  Other findings  Small amount of free fluid in the cul-de-sac.  IMPRESSION: 1. No evidence of ovarian torsion. 2. 7.8 cm lesion with low-level internal echoes in the left ovary, possibly representing an endometrioma or hemorrhagic cyst. Follow-up pelvic ultrasound is recommended in 6 weeks to assess for resolution/ stability. 3. Small benign-appearing cysts in the right ovary measuring up to 3.7 cm.   Electronically Signed   By: Logan Bores M.D.   On: 11/30/2014 20:10   US Pelvis Complete  11/30/2014   CLINICAL DATA:  Abdominal pain.  History of right ovarian cyst.  EXAM: TRANSABDOMINAL AND TRANSVAGINAL ULTRASOUND OF PELVIS  DOPPLER ULTRASOUND OF OVARIES  TECHNIQUE: Both transabdominal and transvaginal ultrasound examinations of the pelvis were performed. Transabdominal technique was performed for global imaging of the pelvis including uterus, ovaries, adnexal regions, and pelvic cul-de-sac.  It was necessary to proceed with endovaginal exam  following the transabdominal exam to visualize the ovaries. Color and duplex Doppler ultrasound was utilized to evaluate blood flow to the ovaries.  COMPARISON:  CT abdomen and pelvis 01/04/2012. Pelvic ultrasound 04/07/2011.  FINDINGS: Uterus  Measurements: 8.0 x 3.6 x 3.7 cm. No fibroids or other mass visualized.  Endometrium  Thickness: 6.5 mm.  No focal abnormality visualized.  Right ovary  Measurements: 6.4 x 3.0 x 3.3 cm. A simple appearing cyst measures 3.7 x 2.7 x 3.5 cm, mildly larger than the 2.9 cm cyst on the 2013 study but without evidence of solid component. This could represent a new cyst versus  mild enlargement of the pre-existing cyst. A separate, smaller cyst measures 2.2 x 1.3 x 1.8 cm.  Left ovary  Measurements: 8.2 x 6.3 x 7.1 cm. 7.8 x 5.8 x 6.9 cm hypoechoic lesion/cyst with diffuse low level internal echoes/ debris and no evidence of internal vascularity.  Pulsed Doppler evaluation of both ovaries demonstrates normal low-resistance arterial and venous waveforms.  Other findings  Small amount of free fluid in the cul-de-sac.  IMPRESSION: 1. No evidence of ovarian torsion. 2. 7.8 cm lesion with low-level internal echoes in the left ovary, possibly representing an endometrioma or hemorrhagic cyst. Follow-up pelvic ultrasound is recommended in 6 weeks to assess for resolution/ stability. 3. Small benign-appearing cysts in the right ovary measuring up to 3.7 cm.   Electronically Signed   By: Logan Bores M.D.   On: 11/30/2014 20:10   [place] Art/ven Flow Abd Pelv Doppler  11/30/2014   CLINICAL DATA:  Abdominal pain.  History of right ovarian cyst.  EXAM: TRANSABDOMINAL AND TRANSVAGINAL ULTRASOUND OF PELVIS  DOPPLER ULTRASOUND OF OVARIES  TECHNIQUE: Both transabdominal and transvaginal ultrasound examinations of the pelvis were performed. Transabdominal technique was performed for global imaging of the pelvis including uterus, ovaries, adnexal regions, and pelvic cul-de-sac.  It was necessary to  proceed with endovaginal exam following the transabdominal exam to visualize the ovaries. Color and duplex Doppler ultrasound was utilized to evaluate blood flow to the ovaries.  COMPARISON:  CT abdomen and pelvis 01/04/2012. Pelvic ultrasound 04/07/2011.  FINDINGS: Uterus  Measurements: 8.0 x 3.6 x 3.7 cm. No fibroids or other mass visualized.  Endometrium  Thickness: 6.5 mm.  No focal abnormality visualized.  Right ovary  Measurements: 6.4 x 3.0 x 3.3 cm. A simple appearing cyst measures 3.7 x 2.7 x 3.5 cm, mildly larger than the 2.9 cm cyst on the 2013 study but without evidence of solid component. This could represent a new cyst versus mild enlargement of the pre-existing cyst. A separate, smaller cyst measures 2.2 x 1.3 x 1.8 cm.  Left ovary  Measurements: 8.2 x 6.3 x 7.1 cm. 7.8 x 5.8 x 6.9 cm hypoechoic lesion/cyst with diffuse low level internal echoes/ debris and no evidence of internal vascularity.  Pulsed Doppler evaluation of both ovaries demonstrates normal low-resistance arterial and venous waveforms.  Other findings  Small amount of free fluid in the cul-de-sac.  IMPRESSION: 1. No evidence of ovarian torsion. 2. 7.8 cm lesion with low-level internal echoes in the left ovary, possibly representing an endometrioma or hemorrhagic cyst. Follow-up pelvic ultrasound is recommended in 6 weeks to assess for resolution/ stability. 3. Small benign-appearing cysts in the right ovary measuring up to 3.7 cm.   Electronically Signed   By: Logan Bores M.D.   On: 11/30/2014 20:10   Reviewed U/S report with Dr. Roselie Awkward.  He advises for discharge of pt with rx for hydrocodone (which she has used before).  He advises for follow up in clinic later this week.   F/u scheduled for 1pm on 9/14.     On discharge discussion = pt reports too much pain to leave and requests percocet prior to leaving.  She has used this and vicodin before without incident per her report (only allergy to straight morphine).  She is given 1  percocet.   Assessment and Plan  A:  1. Left ovarian cyst   2. Abdominal pain   3. Right ovarian cyst     P: Discharge to home Hydrocodone #20 Ibuprofen 868m tid F/u 9/14 at  1pm at [facility] Precautions given for ovarian torsion - immediately come to MAU/ED Patient may return to MAU as needed or if her condition were to change or worsen

## 2014-11-30 NOTE — Discharge Instructions (Signed)
Ovarian Cyst An ovarian cyst is a fluid-filled sac that forms on an ovary. The ovaries are small organs that produce eggs in women. Various types of cysts can form on the ovaries. Most are not cancerous. Many do not cause problems, and they often go away on their own. Some may cause symptoms and require treatment. Common types of ovarian cysts include:  Functional cysts--These cysts may occur every month during the menstrual cycle. This is normal. The cysts usually go away with the next menstrual cycle if the woman does not get pregnant. Usually, there are no symptoms with a functional cyst.  Endometrioma cysts--These cysts form from the tissue that lines the uterus. They are also called "chocolate cysts" because they become filled with blood that turns brown. This type of cyst can cause pain in the lower abdomen during intercourse and with your menstrual period.  Cystadenoma cysts--This type develops from the cells on the outside of the ovary. These cysts can get very big and cause lower abdomen pain and pain with intercourse. This type of cyst can twist on itself, cut off its blood supply, and cause severe pain. It can also easily rupture and cause a lot of pain.  Dermoid cysts--This type of cyst is sometimes found in both ovaries. These cysts may contain different kinds of body tissue, such as skin, teeth, hair, or cartilage. They usually do not cause symptoms unless they get very big.  Theca lutein cysts--These cysts occur when too much of a certain hormone (human chorionic gonadotropin) is produced and overstimulates the ovaries to produce an egg. This is most common after procedures used to assist with the conception of a baby (in vitro fertilization). CAUSES   Fertility drugs can cause a condition in which multiple large cysts are formed on the ovaries. This is called ovarian hyperstimulation syndrome.  A condition called polycystic ovary syndrome can cause hormonal imbalances that can lead to  nonfunctional ovarian cysts. SIGNS AND SYMPTOMS  Many ovarian cysts do not cause symptoms. If symptoms are present, they may include:  Pelvic pain or pressure.  Pain in the lower abdomen.  Pain during sexual intercourse.  Increasing girth (swelling) of the abdomen.  Abnormal menstrual periods.  Increasing pain with menstrual periods.  Stopping having menstrual periods without being pregnant. DIAGNOSIS  These cysts are commonly found during a routine or annual pelvic exam. Tests may be ordered to find out more about the cyst. These tests may include:  Ultrasound.  X-ray of the pelvis.  CT scan.  MRI.  Blood tests. TREATMENT  Many ovarian cysts go away on their own without treatment. Your health care provider may want to check your cyst regularly for 2-3 months to see if it changes. For women in menopause, it is particularly important to monitor a cyst closely because of the higher rate of ovarian cancer in menopausal women. When treatment is needed, it may include any of the following:  A procedure to drain the cyst (aspiration). This may be done using a long needle and ultrasound. It can also be done through a laparoscopic procedure. This involves using a thin, lighted tube with a tiny camera on the end (laparoscope) inserted through a small incision.  Surgery to remove the whole cyst. This may be done using laparoscopic surgery or an open surgery involving a larger incision in the lower abdomen.  Hormone treatment or birth control pills. These methods are sometimes used to help dissolve a cyst. HOME CARE INSTRUCTIONS   Only take over-the-counter   or prescription medicines as directed by your health care provider.  Follow up with your health care provider as directed.  Get regular pelvic exams and Pap tests. SEEK MEDICAL CARE IF:   Your periods are late, irregular, or painful, or they stop.  Your pelvic pain or abdominal pain does not go away.  Your abdomen becomes  larger or swollen.  You have pressure on your bladder or trouble emptying your bladder completely.  You have pain during sexual intercourse.  You have feelings of fullness, pressure, or discomfort in your stomach.  You lose weight for no apparent reason.  You feel generally ill.  You become constipated.  You lose your appetite.  You develop acne.  You have an increase in body and facial hair.  You are gaining weight, without changing your exercise and eating habits.  You think you are pregnant. SEEK IMMEDIATE MEDICAL CARE IF:   You have increasing abdominal pain.  You feel sick to your stomach (nauseous), and you throw up (vomit).  You develop a fever that comes on suddenly.  You have abdominal pain during a bowel movement.  Your menstrual periods become heavier than usual. MAKE SURE YOU:  Understand these instructions.  Will watch your condition.  Will get help right away if you are not doing well or get worse. Document Released: 03/08/2005 Document Revised: 03/13/2013 Document Reviewed: 11/13/2012 Exit[employer] Patient Information 2015 [employer]. This information is not intended to replace advice given to you by your health care provider. Make sure you discuss any questions you have with your health care provider.  

## 2014-11-30 NOTE — MAU Note (Signed)
Patient presents stating she obtained a negative HPT X 1 week ago with c/o abdominal pain and spasms since yesterday that worsened today. States she has a history of ovarian cysts. Denies bleeding or discharge.

## 2014-12-04 ENCOUNTER — Encounter: Payer: Self-pay | Admitting: Family Medicine

## 2014-12-04 ENCOUNTER — Ambulatory Visit (INDEPENDENT_AMBULATORY_CARE_PROVIDER_SITE_OTHER): Payer: Self-pay | Admitting: Family Medicine

## 2014-12-04 ENCOUNTER — Other Ambulatory Visit: Payer: Self-pay | Admitting: Family Medicine

## 2014-12-04 ENCOUNTER — Ambulatory Visit (HOSPITAL_COMMUNITY)
Admission: RE | Admit: 2014-12-04 | Discharge: 2014-12-04 | Disposition: A | Discharge status: Home / Self Care | Payer: Self-pay | Source: Ambulatory Visit | Attending: Family Medicine | Admitting: Family Medicine

## 2014-12-04 VITALS — BP 130/84 | HR 68 | Temp 98.5°F | Resp 20 | Ht 60.0 in | Wt 123.3 lb

## 2014-12-04 DIAGNOSIS — N8320 Unspecified ovarian cysts: Secondary | ICD-10-CM | POA: Insufficient documentation

## 2014-12-04 DIAGNOSIS — N83202 Unspecified ovarian cyst, left side: Secondary | ICD-10-CM

## 2014-12-04 NOTE — Addendum Note (Signed)
Addended by: Geanie Berlin on: 12/04/2014 02:05 PM   Modules accepted: Level of Service

## 2014-12-04 NOTE — Progress Notes (Signed)
Subjective:     Patient ID: Tanya Holland, female   DOB: 1980/06/27, 34 y.o.   MRN: [id number]  HPI Comments: Follow up from [facility]U visit for abdominal pain/ovarian cyst.   At that visit ws given - Toradol in ED and then home with Hydrocodone #20 Ibuprofen  tid.  Pain is 8/10 constantly with worsening 10x per day to 10/10.  Located in lower abdomen and wraps to the right. Has history of 2 cyst removal on the right. Since being seen in MAU,  pain is worsening. Worse with changes in position and better with medications but these are not working well. She reports new onset nausea today. She reports no vomiting. She reports this "feels like when her right cyst was very bad and ruptured."    Review of Systems  Constitutional: Negative for fever and chills.  HENT: Negative for congestion and sore throat.   Eyes: Negative for pain and visual disturbance.  Respiratory: Negative for cough, chest tightness and shortness of breath.   Cardiovascular: Negative for chest pain.  Gastrointestinal: Negative for nausea, vomiting, abdominal pain and diarrhea.  Endocrine: Negative for cold intolerance and heat intolerance.  Genitourinary: Negative for dysuria and flank pain.  Musculoskeletal: Negative for back pain.  Skin: Negative for rash.  Allergic/Immunologic: Negative for food allergies.  Neurological: Negative for dizziness and light-headedness.  Psychiatric/Behavioral: Negative for agitation.       Objective:   Physical Exam  Constitutional: She is oriented to person, place, and time. She appears well-developed and well-nourished.  HENT:  Head: Normocephalic and atraumatic.  Eyes: Conjunctivae are normal.  Neck: Normal range of motion. Neck supple.  Cardiovascular: Normal rate and intact distal pulses.   Pulmonary/Chest: Effort normal.  Abdominal: Soft. There is no tenderness.  Genitourinary: Cervix exhibits no motion tenderness and no friability. Right adnexum displays no mass, no  tenderness and no fullness. Left adnexum displays mass and tenderness. There is tenderness (especially on left) in the vagina.  Musculoskeletal: Normal range of motion.  Neurological: She is alert and oriented to person, place, and time.  Skin: Skin is warm and dry. No erythema.  Psychiatric: She has a normal mood and affect.  Nursing note and vitals reviewed.     Reviewed Transvaginal US results from 9/10 IMPRESSION: 1. No evidence of ovarian torsion. 2. 7.8 cm lesion with low-level internal echoes in the left ovary, possibly representing an endometrioma or hemorrhagic cyst. Follow-up pelvic ultrasound is recommended in 6 weeks to assess for resolution/ stability. 3. Small benign-appearing cysts in the right ovary measuring up to 3.7 cm.  Assessment:     Concern for left ovarian torsion vs rupture.     Plan:     Will send patient directly to  [place] with dopplers if indicated. Coordinated with inpatient attending. Dr. Erin Fulling.

## 2014-12-05 ENCOUNTER — Telehealth: Payer: Self-pay

## 2014-12-05 ENCOUNTER — Telehealth: Payer: Self-pay | Admitting: Family Medicine

## 2014-12-05 NOTE — Telephone Encounter (Signed)
Pt called and stated that she is calling regarding test results from [place].  Called pt and LM to return call to the Clinics.

## 2014-12-05 NOTE — Telephone Encounter (Signed)
Attempted to contact the patient regarding scheduling appt to discuss surgical intervention for left ovarian cyst.  The patient was instructed to call the [facility] clinic to make appt and discuss management of cyst.  Federico Flake, MD

## 2014-12-06 NOTE — Telephone Encounter (Signed)
Called pt and LM that we have scheduled her an appt for 12/12/14.  Please come to the appt that we have scheduled for her on 12/12/14 @ 1315.  If she has any questions to please call the clinics.

## 2014-12-06 NOTE — Telephone Encounter (Signed)
Patient called and left message stating she is returning a call to Dr Alvester Morin and Addison Naegeli

## 2014-12-11 NOTE — Telephone Encounter (Signed)
Pt returned call to the front desk I informed pt that we apologize for the miscommunication concerning her appt.  I informed her of her appt scheduled for 12/12/14 @ 1315.  Pt stated understanding with no further questions.

## 2014-12-12 ENCOUNTER — Encounter: Payer: Self-pay | Admitting: Obstetrics & Gynecology

## 2014-12-12 ENCOUNTER — Ambulatory Visit (INDEPENDENT_AMBULATORY_CARE_PROVIDER_SITE_OTHER): Payer: Self-pay | Admitting: Obstetrics & Gynecology

## 2014-12-12 ENCOUNTER — Encounter (HOSPITAL_COMMUNITY): Payer: Self-pay | Admitting: *Deleted

## 2014-12-12 VITALS — BP 132/85 | HR 68 | Ht <= 58 in | Wt 125.2 lb

## 2014-12-12 DIAGNOSIS — N8320 Unspecified ovarian cysts: Secondary | ICD-10-CM

## 2014-12-12 DIAGNOSIS — N83202 Unspecified ovarian cyst, left side: Secondary | ICD-10-CM

## 2014-12-12 MED ORDER — HYDROCODONE-ACETAMINOPHEN 5-325 MG PO TABS: 1.0000 | ORAL_TABLET | Freq: Four times a day (QID) | ORAL | Status: DC | PRN

## 2014-12-12 NOTE — Patient Instructions (Signed)
Ovarian Cystectomy Ovarian cystectomy is surgery to remove a fluid-filled sac (cyst) on an ovary. The ovaries are small organs that produce eggs in women. Various types of cysts can form on the ovaries. Most are not cancerous. Surgery may be done if a cyst is large or is causing symptoms such as pain. It may also be done for a cyst that is or might be cancerous. This surgery can be done using a laparoscopic technique or an open abdominal technique. The laparoscopic technique involves smaller cuts (incisions) and a faster recovery time. The technique used will depend on your age, the type of cyst, and whether the cyst is cancerous. The laparoscopic technique is not used for a cancerous cyst. LET YOUR HEALTH CARE PROVIDER KNOW ABOUT:   Any allergies you have.  All medicines you are taking, including vitamins, herbs, eye drops, creams, and over-the-counter medicines.  Previous problems you or members of your family have had with the use of anesthetics.  Any blood disorders you have.  Previous surgeries you have had.  Medical conditions you have.  Any chance you might be pregnant. RISKS AND COMPLICATIONS Generally, this is a safe procedure. However, as with any procedure, complications can occur. Possible complications include:  Excessive bleeding.  Infection.  Injury to other organs.  Blood clots.  Becoming incapable of getting pregnant (infertile). BEFORE THE PROCEDURE  Ask your health care provider about changing or stopping any regular medicines. Avoid taking aspirin, ibuprofen, or blood thinners as directed by your health care provider.  Do not eat or drink anything after midnight the night before surgery.  If you smoke, do not smoke for at least 2 weeks before your surgery.  Do not drink alcohol the day before your surgery.  Let your health care provider know if you develop a cold or any infection before your surgery.  Arrange for someone to drive you home after the  procedure or after your hospital stay. Also arrange for someone to help you with activities during recovery. PROCEDURE  Either a laparoscopic technique or an open abdominal technique may be used for this surgery.  Small monitors will be put on your body. They are used to check your heart, blood pressure, and oxygen level.   An IV access tube will be put into one of your veins. Medicine will be able to flow directly into your body through this IV tube.   You might be given a medicine to help you relax (sedative).   You will be given a medicine to make you sleep (general anesthetic). A breathing tube may be placed into your lungs during the procedure. Laparoscopic Technique  Several small cuts (incisions) are made in your abdomen. These are typically about 1 to 2 cm long.   Your abdomen will be filled with carbon dioxide gas so that it expands. This gives the surgeon more room to operate and makes your organs easier to see.   A thin, lighted tube with a tiny camera on the end (laparoscope) is put through one of the small incisions. The camera on the laparoscope sends a picture to a TV screen in the operating room. This gives the surgeon a good view inside your abdomen.   Hollow tubes are put through the other small incisions in your abdomen. The tools needed for the procedure are put through these tubes.  The ovary with the cyst is identified, and the cyst is removed. It is sent to the lab for testing. If it is cancer, both ovaries   may need to be removed during a different surgery.  Tools are removed. The incisions are then closed with stitches or skin glue, and dressings may be applied. Open Abdominal Technique  A single large incision is made along your bikini line or in the middle of your lower abdomen.  The ovary with the cyst is identified, and the cyst is removed. It is sent to the lab for testing. If it is cancer, both ovaries may need to be removed during a different  surgery.  The incision is then closed with stitches or staples. AFTER THE PROCEDURE   You will wake up from anesthesia and be taken to a recovery area.  If you had laparoscopic surgery, you may be able to go home the same day, or you may need to stay in the hospital overnight.  If you had open abdominal surgery, you will need to stay in the hospital for a few days.  Your IV access tube and catheter will be removed the first or second day, after you are able to eat and drink enough.  You may be given medicine to relieve pain or to help you sleep.  You may be given an antibiotic medicine if needed. Document Released: 01/03/2007 Document Revised: 12/27/2012 Document Reviewed: 10/18/2012 [employer] Patient Information 2015 [employer]. This information is not intended to replace advice given to you by your health care provider. Make sure you discuss any questions you have with your health care provider.  

## 2014-12-12 NOTE — Progress Notes (Signed)
CLINIC ENCOUNTER NOTE  History:  34 y.o. G0P0000 here today for follow up for her left ovarian cyst.  Has a lot of associated pain, minimal nausea.  No current nausea, just constant pain in lower abdomen.  Desires surgery soon due to pain. She denies any abnormal vaginal discharge, bleeding or other concerns.   Past Medical History  Diagnosis Date  . Asthma   . Migraines   . Ovarian cyst, right     2 surgeries  . Pneumothorax     Past Surgical History  Procedure Laterality Date  . Ovarian cyst surgery  2006 & 2009    The following portions of the patient's history were reviewed and updated as appropriate: allergies, current medications, past family history, past medical history, past social history, past surgical history and problem list.   Health Maintenance:  Normal pap last year as per patient  Review of Systems:  Pertinent items are noted in HPI. Comprehensive review of systems was otherwise negative.  Objective:  Physical Exam BP 132/85 mmHg  Pulse 68  Ht  (1.473 m)  Wt 125 lb 3.2 oz (56.79 kg)  BMI 26.17 kg/m2  LMP 12/11/2014 (Exact Date) CONSTITUTIONAL: Well-developed, well-nourished female in no acute distress.  HENT:  Normocephalic, atraumatic. External right and left ear normal. Oropharynx is clear and moist EYES: Conjunctivae and EOM are normal. Pupils are equal, round, and reactive to light. No scleral icterus.  NECK: Normal range of motion, supple, no masses SKIN: Skin is warm and dry. No rash noted. Not diaphoretic. No erythema. No pallor. NEUROLGIC: Alert and oriented to person, place, and time. Normal reflexes, muscle tone coordination. No cranial nerve deficit noted. PSYCHIATRIC: Normal mood and affect. Normal behavior. Normal judgment and thought content. CARDIOVASCULAR: Normal heart rate noted RESPIRATORY: Effort and breath sounds normal, no problems with respiration noted ABDOMEN: Soft, no distention noted, moderate diffuse lower abdomen  tenderness to palpation, no rebound/no guarding.   PELVIC: Deferred MUSCULOSKELETAL: Normal range of motion. No edema noted.  Labs and Imaging US Transvaginal Non-ob  12/04/2014   CLINICAL DATA:  Left ovarian cyst, rule out torsion or rupture.  EXAM: TRANSVAGINAL ULTRASOUND OF PELVIS  DOPPLER ULTRASOUND OF OVARIES  TECHNIQUE: Transvaginal ultrasound examination of the pelvis was performed including evaluation of the uterus, ovaries, adnexal regions, and pelvic cul-de-sac.  Color and duplex Doppler ultrasound was utilized to evaluate blood flow to the ovaries.  COMPARISON:  Four days ago  FINDINGS: Uterus  Measurements: 8 x 3 x 4 cm. No fibroids or other mass visualized.  Endometrium  Thickness: 7 mm. The fundic portion of the endometrial cavity is anteriorly displaced, but there is no underlying mass/ fibroid.  Right ovary  Measurements: 57 x 35 x 42 mm. A simple 36 mm cyst is noted, likely follicular.  Left ovary  Measurements: 77 x 60 x 69 mm. Re- identified complex cystic mass measuring 72 x 54 x 66 mm, likely stable when allowing for differences in measurement. Internal mid level echoes and peripheral nodularity present. Only some of the peripheral nodular are echogenic. The largest nodule is 5 mm.  Pulsed Doppler evaluation demonstrates normal low-resistance arterial and venous waveforms in both ovaries.  Small free fluid in the left pelvis.  IMPRESSION: 1. No acute finding.  Normal ovarian blood flow. 2. 72 mm complex left ovarian cyst with mural nodules. Endometrioma or epithelial neoplasm could have this appearance. 3. Simple right ovarian cyst measuring 36 mm. 4. Small simple pelvic fluid.   Electronically Signed  By: Marnee Spring M.D.   On: 12/04/2014 15:32   US Transvaginal Non-ob  11/30/2014   CLINICAL DATA:  Abdominal pain.  History of right ovarian cyst.  EXAM: TRANSABDOMINAL AND TRANSVAGINAL ULTRASOUND OF PELVIS  DOPPLER ULTRASOUND OF OVARIES  TECHNIQUE: Both transabdominal and  transvaginal ultrasound examinations of the pelvis were performed. Transabdominal technique was performed for global imaging of the pelvis including uterus, ovaries, adnexal regions, and pelvic cul-de-sac.  It was necessary to proceed with endovaginal exam following the transabdominal exam to visualize the ovaries. Color and duplex Doppler ultrasound was utilized to evaluate blood flow to the ovaries.  COMPARISON:  CT abdomen and pelvis 01/04/2012. Pelvic ultrasound 04/07/2011.  FINDINGS: Uterus  Measurements: 8.0 x 3.6 x 3.7 cm. No fibroids or other mass visualized.  Endometrium  Thickness: 6.5 mm.  No focal abnormality visualized.  Right ovary  Measurements: 6.4 x 3.0 x 3.3 cm. A simple appearing cyst measures 3.7 x 2.7 x 3.5 cm, mildly larger than the 2.9 cm cyst on the 2013 study but without evidence of solid component. This could represent a new cyst versus mild enlargement of the pre-existing cyst. A separate, smaller cyst measures 2.2 x 1.3 x 1.8 cm.  Left ovary  Measurements: 8.2 x 6.3 x 7.1 cm. 7.8 x 5.8 x 6.9 cm hypoechoic lesion/cyst with diffuse low level internal echoes/ debris and no evidence of internal vascularity.  Pulsed Doppler evaluation of both ovaries demonstrates normal low-resistance arterial and venous waveforms.  Other findings  Small amount of free fluid in the cul-de-sac.  IMPRESSION: 1. No evidence of ovarian torsion. 2. 7.8 cm lesion with low-level internal echoes in the left ovary, possibly representing an endometrioma or hemorrhagic cyst. Follow-up pelvic ultrasound is recommended in 6 weeks to assess for resolution/ stability. 3. Small benign-appearing cysts in the right ovary measuring up to 3.7 cm.   Electronically Signed   By: Sebastian Ache M.D.   On: 11/30/2014 20:10   US Pelvis Complete  11/30/2014   CLINICAL DATA:  Abdominal pain.  History of right ovarian cyst.  EXAM: TRANSABDOMINAL AND TRANSVAGINAL ULTRASOUND OF PELVIS  DOPPLER ULTRASOUND OF OVARIES  TECHNIQUE: Both  transabdominal and transvaginal ultrasound examinations of the pelvis were performed. Transabdominal technique was performed for global imaging of the pelvis including uterus, ovaries, adnexal regions, and pelvic cul-de-sac.  It was necessary to proceed with endovaginal exam following the transabdominal exam to visualize the ovaries. Color and duplex Doppler ultrasound was utilized to evaluate blood flow to the ovaries.  COMPARISON:  CT abdomen and pelvis 01/04/2012. Pelvic ultrasound 04/07/2011.  FINDINGS: Uterus  Measurements: 8.0 x 3.6 x 3.7 cm. No fibroids or other mass visualized.  Endometrium  Thickness: 6.5 mm.  No focal abnormality visualized.  Right ovary  Measurements: 6.4 x 3.0 x 3.3 cm. A simple appearing cyst measures 3.7 x 2.7 x 3.5 cm, mildly larger than the 2.9 cm cyst on the 2013 study but without evidence of solid component. This could represent a new cyst versus mild enlargement of the pre-existing cyst. A separate, smaller cyst measures 2.2 x 1.3 x 1.8 cm.  Left ovary  Measurements: 8.2 x 6.3 x 7.1 cm. 7.8 x 5.8 x 6.9 cm hypoechoic lesion/cyst with diffuse low level internal echoes/ debris and no evidence of internal vascularity.  Pulsed Doppler evaluation of both ovaries demonstrates normal low-resistance arterial and venous waveforms.  Other findings  Small amount of free fluid in the cul-de-sac.  IMPRESSION: 1. No evidence of ovarian torsion. 2.  7.8 cm lesion with low-level internal echoes in the left ovary, possibly representing an endometrioma or hemorrhagic cyst. Follow-up pelvic ultrasound is recommended in 6 weeks to assess for resolution/ stability. 3. Small benign-appearing cysts in the right ovary measuring up to 3.7 cm.   Electronically Signed   By: Sebastian Ache M.D.   On: 11/30/2014 20:10   [place] Art/ven Flow Abd Pelv Doppler  12/04/2014   CLINICAL DATA:  Left ovarian cyst, rule out torsion or rupture.  EXAM: TRANSVAGINAL ULTRASOUND OF PELVIS  DOPPLER ULTRASOUND OF OVARIES   TECHNIQUE: Transvaginal ultrasound examination of the pelvis was performed including evaluation of the uterus, ovaries, adnexal regions, and pelvic cul-de-sac.  Color and duplex Doppler ultrasound was utilized to evaluate blood flow to the ovaries.  COMPARISON:  Four days ago  FINDINGS: Uterus  Measurements: 8 x 3 x 4 cm. No fibroids or other mass visualized.  Endometrium  Thickness: 7 mm. The fundic portion of the endometrial cavity is anteriorly displaced, but there is no underlying mass/ fibroid.  Right ovary  Measurements: 57 x 35 x 42 mm. A simple 36 mm cyst is noted, likely follicular.  Left ovary  Measurements: 77 x 60 x 69 mm. Re- identified complex cystic mass measuring 72 x 54 x 66 mm, likely stable when allowing for differences in measurement. Internal mid level echoes and peripheral nodularity present. Only some of the peripheral nodular are echogenic. The largest nodule is 5 mm.  Pulsed Doppler evaluation demonstrates normal low-resistance arterial and venous waveforms in both ovaries.  Small free fluid in the left pelvis.  IMPRESSION: 1. No acute finding.  Normal ovarian blood flow. 2. 72 mm complex left ovarian cyst with mural nodules. Endometrioma or epithelial neoplasm could have this appearance. 3. Simple right ovarian cyst measuring 36 mm. 4. Small simple pelvic fluid.   Electronically Signed   By: Marnee Spring M.D.   On: 12/04/2014 15:32   [place] Art/ven Flow Abd Pelv Doppler  11/30/2014   CLINICAL DATA:  Abdominal pain.  History of right ovarian cyst.  EXAM: TRANSABDOMINAL AND TRANSVAGINAL ULTRASOUND OF PELVIS  DOPPLER ULTRASOUND OF OVARIES  TECHNIQUE: Both transabdominal and transvaginal ultrasound examinations of the pelvis were performed. Transabdominal technique was performed for global imaging of the pelvis including uterus, ovaries, adnexal regions, and pelvic cul-de-sac.  It was necessary to proceed with endovaginal exam following the transabdominal exam to visualize the ovaries. Color  and duplex Doppler ultrasound was utilized to evaluate blood flow to the ovaries.  COMPARISON:  CT abdomen and pelvis 01/04/2012. Pelvic ultrasound 04/07/2011.  FINDINGS: Uterus  Measurements: 8.0 x 3.6 x 3.7 cm. No fibroids or other mass visualized.  Endometrium  Thickness: 6.5 mm.  No focal abnormality visualized.  Right ovary  Measurements: 6.4 x 3.0 x 3.3 cm. A simple appearing cyst measures 3.7 x 2.7 x 3.5 cm, mildly larger than the 2.9 cm cyst on the 2013 study but without evidence of solid component. This could represent a new cyst versus mild enlargement of the pre-existing cyst. A separate, smaller cyst measures 2.2 x 1.3 x 1.8 cm.  Left ovary  Measurements: 8.2 x 6.3 x 7.1 cm. 7.8 x 5.8 x 6.9 cm hypoechoic lesion/cyst with diffuse low level internal echoes/ debris and no evidence of internal vascularity.  Pulsed Doppler evaluation of both ovaries demonstrates normal low-resistance arterial and venous waveforms.  Other findings  Small amount of free fluid in the cul-de-sac.  IMPRESSION: 1. No evidence of ovarian torsion. 2. 7.8 cm  lesion with low-level internal echoes in the left ovary, possibly representing an endometrioma or hemorrhagic cyst. Follow-up pelvic ultrasound is recommended in 6 weeks to assess for resolution/ stability. 3. Small benign-appearing cysts in the right ovary measuring up to 3.7 cm.   Electronically Signed   By: Sebastian Ache M.D.   On: 11/30/2014 20:10    Assessment & Plan:  Counseled patient about needing laparoscopic ovarian cystectomy, possible left salpingooophorectomy.  Patient agreed to plan after counseling; surgery scheduled 12/26/14 at 0930.  Routine preoperative instructions of having nothing to eat or drink after midnight on the day prior to surgery and also coming to the hospital 1.5 hours prior to her time of surgery were also emphasized.  Vicodin given to patient for pain.   Please refer to After Visit Summary for other counseling recommendations.     Jaynie Collins, MD, FACOG Attending Obstetrician & Gynecologist, [facility]

## 2014-12-13 ENCOUNTER — Ambulatory Visit: Payer: Self-pay | Admitting: Obstetrics & Gynecology

## 2014-12-13 LAB — CA 125: CA 125: 44 U/mL — ABNORMAL HIGH (ref <35)

## 2014-12-17 ENCOUNTER — Encounter: Payer: Self-pay | Admitting: General Practice

## 2014-12-23 ENCOUNTER — Encounter (HOSPITAL_COMMUNITY): Payer: Self-pay

## 2014-12-23 ENCOUNTER — Encounter (HOSPITAL_COMMUNITY)
Admission: RE | Admit: 2014-12-23 | Discharge: 2014-12-23 | Disposition: A | Discharge status: Home / Self Care | Payer: BLUE CROSS/BLUE SHIELD | Source: Ambulatory Visit | Attending: Obstetrics & Gynecology | Admitting: Obstetrics & Gynecology

## 2014-12-23 DIAGNOSIS — Z01818 Encounter for other preprocedural examination: Secondary | ICD-10-CM | POA: Insufficient documentation

## 2014-12-23 DIAGNOSIS — N83202 Unspecified ovarian cyst, left side: Secondary | ICD-10-CM | POA: Insufficient documentation

## 2014-12-23 LAB — CBC
HCT: 37.1 % (ref 36.0–46.0)
Hemoglobin: 12 g/dL (ref 12.0–15.0)
MCH: 27.4 pg (ref 26.0–34.0)
MCHC: 32.3 g/dL (ref 30.0–36.0)
MCV: 84.7 fL (ref 78.0–100.0)
PLATELETS: 380 10*3/uL (ref 150–400)
RBC: 4.38 MIL/uL (ref 3.87–5.11)
RDW: 13.7 % (ref 11.5–15.5)
WBC: 9.3 10*3/uL (ref 4.0–10.5)

## 2014-12-23 NOTE — Patient Instructions (Addendum)
   Your procedure is scheduled on: OCT 6  Enter through the Main Entrance of [facility] at: 8AM  Pick up the phone at the desk and dial [phone] and inform us of your arrival.  Please call this number if you have any problems the morning of surgery: [phone]  Remember: DO NOT EAT OR DRINK AFTER MIDNIGHT    Do not wear jewelry, make-up,  No metal in your hair or on your body. Do not wear lotions, powders, perfumes.  You may wear deodorant.  Do not bring valuables to the hospital. Contacts, dentures or bridgework may not be worn into surgery.    Patients discharged on the day of surgery will not be allowed to drive home.

## 2014-12-24 ENCOUNTER — Encounter: Payer: Self-pay | Admitting: *Deleted

## 2014-12-25 ENCOUNTER — Telehealth: Payer: Self-pay | Admitting: *Deleted

## 2014-12-25 ENCOUNTER — Other Ambulatory Visit: Payer: Self-pay | Admitting: *Deleted

## 2014-12-25 DIAGNOSIS — F419 Anxiety disorder, unspecified: Secondary | ICD-10-CM

## 2014-12-25 MED ORDER — HYDROXYZINE PAMOATE 25 MG PO CAPS: 25.0000 mg | ORAL_CAPSULE | Freq: Three times a day (TID) | ORAL | Status: DC | PRN

## 2014-12-25 NOTE — Telephone Encounter (Signed)
Pt called requesting xanax to take because she is very anxious about her surgery. Advised patient we would ask Dr. Macon Large this afternoon and call her back. Patient agreeable to this. She can be reached, at [phone].

## 2014-12-26 ENCOUNTER — Encounter (HOSPITAL_COMMUNITY)
Admission: RE | Disposition: A | Discharge status: Home / Self Care | Payer: Self-pay | Source: Ambulatory Visit | Attending: Obstetrics & Gynecology

## 2014-12-26 ENCOUNTER — Ambulatory Visit (HOSPITAL_COMMUNITY): Payer: BLUE CROSS/BLUE SHIELD | Admitting: Anesthesiology

## 2014-12-26 ENCOUNTER — Encounter (HOSPITAL_COMMUNITY): Payer: Self-pay | Admitting: Anesthesiology

## 2014-12-26 ENCOUNTER — Ambulatory Visit (HOSPITAL_COMMUNITY)
Admission: RE | Admit: 2014-12-26 | Discharge: 2014-12-26 | Disposition: A | Discharge status: Home / Self Care | Payer: BLUE CROSS/BLUE SHIELD | Source: Ambulatory Visit | Attending: Obstetrics & Gynecology | Admitting: Obstetrics & Gynecology

## 2014-12-26 DIAGNOSIS — J45909 Unspecified asthma, uncomplicated: Secondary | ICD-10-CM | POA: Insufficient documentation

## 2014-12-26 DIAGNOSIS — N83202 Unspecified ovarian cyst, left side: Secondary | ICD-10-CM | POA: Insufficient documentation

## 2014-12-26 DIAGNOSIS — Z888 Allergy status to other drugs, medicaments and biological substances status: Secondary | ICD-10-CM | POA: Insufficient documentation

## 2014-12-26 DIAGNOSIS — N83291 Other ovarian cyst, right side: Secondary | ICD-10-CM | POA: Insufficient documentation

## 2014-12-26 DIAGNOSIS — G43909 Migraine, unspecified, not intractable, without status migrainosus: Secondary | ICD-10-CM | POA: Insufficient documentation

## 2014-12-26 DIAGNOSIS — Z885 Allergy status to narcotic agent status: Secondary | ICD-10-CM | POA: Insufficient documentation

## 2014-12-26 HISTORY — PX: SALPINGOOPHORECTOMY: SHX82

## 2014-12-26 LAB — PREGNANCY, URINE: Preg Test, Ur: NEGATIVE

## 2014-12-26 SURGERY — SALPINGO-OOPHORECTOMY, OPEN
Anesthesia: General | Laterality: Left

## 2014-12-26 MED ORDER — BUPIVACAINE HCL (PF) 0.5 % IJ SOLN
INTRAMUSCULAR | Status: DC | PRN
Start: 2014-12-26 — End: 2014-12-26
  Administered 2014-12-26: 10 mL

## 2014-12-26 MED ORDER — IBUPROFEN 800 MG PO TABS: 800.0000 mg | ORAL_TABLET | Freq: Three times a day (TID) | ORAL | Status: DC | PRN

## 2014-12-26 MED ORDER — LIDOCAINE HCL (CARDIAC) 20 MG/ML IV SOLN
INTRAVENOUS | Status: DC | PRN
  Administered 2014-12-26: 60 mg via INTRAVENOUS

## 2014-12-26 MED ORDER — PROPOFOL 10 MG/ML IV BOLUS
INTRAVENOUS | Status: AC
  Filled 2014-12-26: qty 20

## 2014-12-26 MED ORDER — ONDANSETRON HCL 4 MG/2ML IJ SOLN
INTRAMUSCULAR | Status: DC | PRN
  Administered 2014-12-26: 4 mg via INTRAVENOUS

## 2014-12-26 MED ORDER — FENTANYL CITRATE (PF) 250 MCG/5ML IJ SOLN
INTRAMUSCULAR | Status: AC
  Filled 2014-12-26: qty 25

## 2014-12-26 MED ORDER — HYDROCODONE-ACETAMINOPHEN 7.5-325 MG PO TABS
1.0000 | ORAL_TABLET | Freq: Once | ORAL | Status: AC | PRN
  Administered 2014-12-26: 1 via ORAL

## 2014-12-26 MED ORDER — LACTATED RINGERS IV SOLN: INTRAVENOUS | Status: DC

## 2014-12-26 MED ORDER — HYDROMORPHONE HCL 1 MG/ML IJ SOLN
INTRAMUSCULAR | Status: AC
  Filled 2014-12-26: qty 1

## 2014-12-26 MED ORDER — GLYCOPYRROLATE 0.2 MG/ML IJ SOLN
INTRAMUSCULAR | Status: DC | PRN
  Administered 2014-12-26: .8 mg via INTRAVENOUS

## 2014-12-26 MED ORDER — ONDANSETRON HCL 4 MG/2ML IJ SOLN
INTRAMUSCULAR | Status: AC
  Filled 2014-12-26: qty 2

## 2014-12-26 MED ORDER — NEOSTIGMINE METHYLSULFATE 10 MG/10ML IV SOLN
INTRAVENOUS | Status: AC
  Filled 2014-12-26: qty 1

## 2014-12-26 MED ORDER — CEFAZOLIN SODIUM-DEXTROSE 2-3 GM-% IV SOLR
2.0000 g | INTRAVENOUS | Status: AC
  Administered 2014-12-26: 2 g via INTRAVENOUS

## 2014-12-26 MED ORDER — NEOSTIGMINE METHYLSULFATE 10 MG/10ML IV SOLN
INTRAVENOUS | Status: DC | PRN
  Administered 2014-12-26: 4 mg via INTRAVENOUS

## 2014-12-26 MED ORDER — HYDROMORPHONE HCL 1 MG/ML IJ SOLN: 0.2500 mg | INTRAMUSCULAR | Status: DC | PRN

## 2014-12-26 MED ORDER — LIDOCAINE HCL (CARDIAC) 20 MG/ML IV SOLN
INTRAVENOUS | Status: AC
  Filled 2014-12-26: qty 5

## 2014-12-26 MED ORDER — BUPIVACAINE HCL (PF) 0.5 % IJ SOLN
INTRAMUSCULAR | Status: AC
  Filled 2014-12-26: qty 30

## 2014-12-26 MED ORDER — HYDROMORPHONE HCL 1 MG/ML IJ SOLN
INTRAMUSCULAR | Status: AC
Start: 2014-12-26 — End: 2014-12-26
  Filled 2014-12-26: qty 1

## 2014-12-26 MED ORDER — SCOPOLAMINE 1 MG/3DAYS TD PT72
1.0000 | MEDICATED_PATCH | Freq: Once | TRANSDERMAL | Status: DC
  Administered 2014-12-26: 1.5 mg via TRANSDERMAL

## 2014-12-26 MED ORDER — DOCUSATE SODIUM 100 MG PO CAPS: 100.0000 mg | ORAL_CAPSULE | Freq: Two times a day (BID) | ORAL | Status: DC | PRN

## 2014-12-26 MED ORDER — HYDROMORPHONE HCL 1 MG/ML IJ SOLN
INTRAMUSCULAR | Status: DC | PRN
Start: 2014-12-26 — End: 2014-12-26
  Administered 2014-12-26 (×2): 0.5 mg via INTRAVENOUS
  Administered 2014-12-26: 1 mg via INTRAVENOUS

## 2014-12-26 MED ORDER — HYDROCODONE-ACETAMINOPHEN 5-325 MG PO TABS: 1.0000 | ORAL_TABLET | Freq: Four times a day (QID) | ORAL | Status: AC | PRN

## 2014-12-26 MED ORDER — HEPARIN SODIUM (PORCINE) 5000 UNIT/ML IJ SOLN
INTRAMUSCULAR | Status: DC | PRN
  Administered 2014-12-26: 5000 [IU]

## 2014-12-26 MED ORDER — METOCLOPRAMIDE HCL 5 MG/ML IJ SOLN
10.0000 mg | Freq: Once | INTRAMUSCULAR | Status: DC | PRN
Start: 2014-12-26 — End: 2014-12-26

## 2014-12-26 MED ORDER — MIDAZOLAM HCL 2 MG/2ML IJ SOLN
INTRAMUSCULAR | Status: DC | PRN
  Administered 2014-12-26: 2 mg via INTRAVENOUS

## 2014-12-26 MED ORDER — FENTANYL CITRATE (PF) 100 MCG/2ML IJ SOLN: INTRAMUSCULAR | Status: DC | PRN

## 2014-12-26 MED ORDER — ROCURONIUM BROMIDE 100 MG/10ML IV SOLN
INTRAVENOUS | Status: AC
  Filled 2014-12-26: qty 1

## 2014-12-26 MED ORDER — MIDAZOLAM HCL 2 MG/2ML IJ SOLN
INTRAMUSCULAR | Status: AC
  Filled 2014-12-26: qty 4

## 2014-12-26 MED ORDER — LACTATED RINGERS IV SOLN
INTRAVENOUS | Status: DC
  Administered 2014-12-26 (×3): via INTRAVENOUS

## 2014-12-26 MED ORDER — MEPERIDINE HCL 25 MG/ML IJ SOLN: 6.2500 mg | INTRAMUSCULAR | Status: DC | PRN

## 2014-12-26 MED ORDER — HYDROCODONE-ACETAMINOPHEN 7.5-325 MG PO TABS
ORAL_TABLET | ORAL | Status: AC
  Filled 2014-12-26: qty 1

## 2014-12-26 MED ORDER — DEXAMETHASONE SODIUM PHOSPHATE 4 MG/ML IJ SOLN
INTRAMUSCULAR | Status: DC | PRN
  Administered 2014-12-26: 4 mg via INTRAVENOUS

## 2014-12-26 MED ORDER — HEPARIN SODIUM (PORCINE) 5000 UNIT/ML IJ SOLN
INTRAMUSCULAR | Status: AC
  Filled 2014-12-26: qty 1

## 2014-12-26 MED ORDER — ROCURONIUM BROMIDE 100 MG/10ML IV SOLN
INTRAVENOUS | Status: DC | PRN
  Administered 2014-12-26: 35 mg via INTRAVENOUS

## 2014-12-26 MED ORDER — LACTATED RINGERS IR SOLN
Status: DC | PRN
  Administered 2014-12-26: 3000 mL

## 2014-12-26 MED ORDER — FENTANYL CITRATE (PF) 250 MCG/5ML IJ SOLN
INTRAMUSCULAR | Status: DC | PRN
  Administered 2014-12-26: 100 ug via INTRAVENOUS
  Administered 2014-12-26: 50 ug via INTRAVENOUS
  Administered 2014-12-26: 100 ug via INTRAVENOUS

## 2014-12-26 MED ORDER — SCOPOLAMINE 1 MG/3DAYS TD PT72
MEDICATED_PATCH | TRANSDERMAL | Status: AC
  Administered 2014-12-26: 1.5 mg via TRANSDERMAL
  Filled 2014-12-26: qty 1

## 2014-12-26 MED ORDER — GLYCOPYRROLATE 0.2 MG/ML IJ SOLN
INTRAMUSCULAR | Status: AC
  Filled 2014-12-26: qty 4

## 2014-12-26 MED ORDER — KETOROLAC TROMETHAMINE 30 MG/ML IJ SOLN
INTRAMUSCULAR | Status: DC | PRN
  Administered 2014-12-26: 30 mg via INTRAVENOUS

## 2014-12-26 MED ORDER — CEFAZOLIN SODIUM-DEXTROSE 2-3 GM-% IV SOLR
INTRAVENOUS | Status: AC
  Filled 2014-12-26: qty 50

## 2014-12-26 MED ORDER — DEXAMETHASONE SODIUM PHOSPHATE 10 MG/ML IJ SOLN
INTRAMUSCULAR | Status: AC
  Filled 2014-12-26: qty 1

## 2014-12-26 MED ORDER — PROPOFOL 10 MG/ML IV BOLUS
INTRAVENOUS | Status: DC | PRN
  Administered 2014-12-26: 200 mg via INTRAVENOUS

## 2014-12-26 SURGICAL SUPPLY — 29 items
CABLE HIGH FREQUENCY MONO STRZ (ELECTRODE) IMPLANT
CLOTH BEACON ORANGE TIMEOUT ST (SAFETY) ×2 IMPLANT
DRSG COVADERM PLUS 2X2 (GAUZE/BANDAGES/DRESSINGS) ×4 IMPLANT
DRSG OPSITE POSTOP 3X4 (GAUZE/BANDAGES/DRESSINGS) ×2 IMPLANT
DRSG TELFA 3X8 NADH (GAUZE/BANDAGES/DRESSINGS) ×2 IMPLANT
DURAPREP 26ML APPLICATOR (WOUND CARE) ×2 IMPLANT
GLOVE BIOGEL PI IND STRL 7.0 (GLOVE) ×2 IMPLANT
GLOVE BIOGEL PI INDICATOR 7.0 (GLOVE) ×2
GLOVE ECLIPSE 7.0 STRL STRAW (GLOVE) ×2 IMPLANT
GOWN STRL REUS W/TWL LRG LVL3 (GOWN DISPOSABLE) ×6 IMPLANT
HEMOSTAT SURGICEL 2X14 (HEMOSTASIS) ×2 IMPLANT
LIQUID BAND (GAUZE/BANDAGES/DRESSINGS) ×2 IMPLANT
NEEDLE INSUFFLATION 120MM (ENDOMECHANICALS) ×2 IMPLANT
NS IRRIG 1000ML POUR BTL (IV SOLUTION) ×2 IMPLANT
PACK LAPAROSCOPY BASIN (CUSTOM PROCEDURE TRAY) ×2 IMPLANT
PAD POSITIONING PINK XL (MISCELLANEOUS) ×2 IMPLANT
POUCH SPECIMEN RETRIEVAL 10MM (ENDOMECHANICALS) IMPLANT
SET IRRIG TUBING LAPAROSCOPIC (IRRIGATION / IRRIGATOR) ×2 IMPLANT
SHEARS HARMONIC ACE PLUS 36CM (ENDOMECHANICALS) IMPLANT
SUT VICRYL 0 UR6 27IN ABS (SUTURE) ×4 IMPLANT
SUT VICRYL 4-0 PS2 18IN ABS (SUTURE) ×2 IMPLANT
SYR TB 1ML 27GX1/2 SAFE (SYRINGE) ×1 IMPLANT
SYR TB 1ML 27GX1/2 SAFETY (SYRINGE) ×1
TOWEL OR 17X24 6PK STRL BLUE (TOWEL DISPOSABLE) ×4 IMPLANT
TRAY FOLEY CATH SILVER 14FR (SET/KITS/TRAYS/PACK) ×2 IMPLANT
TROCAR XCEL NON-BLD 11X100MML (ENDOMECHANICALS) ×2 IMPLANT
TROCAR XCEL NON-BLD 5MMX100MML (ENDOMECHANICALS) ×4 IMPLANT
WARMER LAPAROSCOPE (MISCELLANEOUS) ×2 IMPLANT
WATER STERILE IRR 1000ML POUR (IV SOLUTION) IMPLANT

## 2014-12-26 NOTE — Transfer of Care (Signed)
Immediate Anesthesia Transfer of Care Note  Patient: Tanya Holland  Procedure(s) Performed: Procedure(s): laporoscopic  partial left oophorectomy. drainage of right ovarian cyst (Left)  Patient Location: PACU  Anesthesia Type:General  Level of Consciousness: awake, alert  and oriented  Airway & Oxygen Therapy: Patient Spontanous Breathing and Patient connected to nasal cannula oxygen  Post-op Assessment: Report given to RN and Post -op Vital signs reviewed and stable  Post vital signs: Reviewed and stable  Last Vitals:  Filed Vitals:   12/26/14 0809  BP: 137/91    Complications: No apparent anesthesia complications

## 2014-12-26 NOTE — Discharge Instructions (Addendum)

## 2014-12-26 NOTE — H&P (View-Only) (Signed)
 CLINIC ENCOUNTER NOTE  History:  34 y.o. G0P0000 here today for follow up for her left ovarian cyst.  Has a lot of associated pain, minimal nausea.  No current nausea, just constant pain in lower abdomen.  Desires surgery soon due to pain. She denies any abnormal vaginal discharge, bleeding or other concerns.   Past Medical History  Diagnosis Date  . Asthma   . Migraines   . Ovarian cyst, right     2 surgeries  . Pneumothorax     Past Surgical History  Procedure Laterality Date  . Ovarian cyst surgery  2006 & 2009    The following portions of the patient's history were reviewed and updated as appropriate: allergies, current medications, past family history, past medical history, past social history, past surgical history and problem list.   Health Maintenance:  Normal pap last year as per patient  Review of Systems:  Pertinent items are noted in HPI. Comprehensive review of systems was otherwise negative.  Objective:  Physical Exam BP 132/85 mmHg  Pulse 68  Ht 4' 10" (1.473 m)  Wt 125 lb 3.2 oz (56.79 kg)  BMI 26.17 kg/m2  LMP 12/11/2014 (Exact Date) CONSTITUTIONAL: Well-developed, well-nourished female in no acute distress.  HENT:  Normocephalic, atraumatic. External right and left ear normal. Oropharynx is clear and moist EYES: Conjunctivae and EOM are normal. Pupils are equal, round, and reactive to light. No scleral icterus.  NECK: Normal range of motion, supple, no masses SKIN: Skin is warm and dry. No rash noted. Not diaphoretic. No erythema. No pallor. NEUROLGIC: Alert and oriented to person, place, and time. Normal reflexes, muscle tone coordination. No cranial nerve deficit noted. PSYCHIATRIC: Normal mood and affect. Normal behavior. Normal judgment and thought content. CARDIOVASCULAR: Normal heart rate noted RESPIRATORY: Effort and breath sounds normal, no problems with respiration noted ABDOMEN: Soft, no distention noted, moderate diffuse lower abdomen  tenderness to palpation, no rebound/no guarding.   PELVIC: Deferred MUSCULOSKELETAL: Normal range of motion. No edema noted.  Labs and Imaging Us Transvaginal Non-ob  12/04/2014   CLINICAL DATA:  Left ovarian cyst, rule out torsion or rupture.  EXAM: TRANSVAGINAL ULTRASOUND OF PELVIS  DOPPLER ULTRASOUND OF OVARIES  TECHNIQUE: Transvaginal ultrasound examination of the pelvis was performed including evaluation of the uterus, ovaries, adnexal regions, and pelvic cul-de-sac.  Color and duplex Doppler ultrasound was utilized to evaluate blood flow to the ovaries.  COMPARISON:  Four days ago  FINDINGS: Uterus  Measurements: 8 x 3 x 4 cm. No fibroids or other mass visualized.  Endometrium  Thickness: 7 mm. The fundic portion of the endometrial cavity is anteriorly displaced, but there is no underlying mass/ fibroid.  Right ovary  Measurements: 57 x 35 x 42 mm. A simple 36 mm cyst is noted, likely follicular.  Left ovary  Measurements: 77 x 60 x 69 mm. Re- identified complex cystic mass measuring 72 x 54 x 66 mm, likely stable when allowing for differences in measurement. Internal mid level echoes and peripheral nodularity present. Only some of the peripheral nodular are echogenic. The largest nodule is 5 mm.  Pulsed Doppler evaluation demonstrates normal low-resistance arterial and venous waveforms in both ovaries.  Small free fluid in the left pelvis.  IMPRESSION: 1. No acute finding.  Normal ovarian blood flow. 2. 72 mm complex left ovarian cyst with mural nodules. Endometrioma or epithelial neoplasm could have this appearance. 3. Simple right ovarian cyst measuring 36 mm. 4. Small simple pelvic fluid.   Electronically Signed     By: Jonathon  Watts M.D.   On: 12/04/2014 15:32   Us Transvaginal Non-ob  11/30/2014   CLINICAL DATA:  Abdominal pain.  History of right ovarian cyst.  EXAM: TRANSABDOMINAL AND TRANSVAGINAL ULTRASOUND OF PELVIS  DOPPLER ULTRASOUND OF OVARIES  TECHNIQUE: Both transabdominal and  transvaginal ultrasound examinations of the pelvis were performed. Transabdominal technique was performed for global imaging of the pelvis including uterus, ovaries, adnexal regions, and pelvic cul-de-sac.  It was necessary to proceed with endovaginal exam following the transabdominal exam to visualize the ovaries. Color and duplex Doppler ultrasound was utilized to evaluate blood flow to the ovaries.  COMPARISON:  CT abdomen and pelvis 01/04/2012. Pelvic ultrasound 04/07/2011.  FINDINGS: Uterus  Measurements: 8.0 x 3.6 x 3.7 cm. No fibroids or other mass visualized.  Endometrium  Thickness: 6.5 mm.  No focal abnormality visualized.  Right ovary  Measurements: 6.4 x 3.0 x 3.3 cm. A simple appearing cyst measures 3.7 x 2.7 x 3.5 cm, mildly larger than the 2.9 cm cyst on the 2013 study but without evidence of solid component. This could represent a new cyst versus mild enlargement of the pre-existing cyst. A separate, smaller cyst measures 2.2 x 1.3 x 1.8 cm.  Left ovary  Measurements: 8.2 x 6.3 x 7.1 cm. 7.8 x 5.8 x 6.9 cm hypoechoic lesion/cyst with diffuse low level internal echoes/ debris and no evidence of internal vascularity.  Pulsed Doppler evaluation of both ovaries demonstrates normal low-resistance arterial and venous waveforms.  Other findings  Small amount of free fluid in the cul-de-sac.  IMPRESSION: 1. No evidence of ovarian torsion. 2. 7.8 cm lesion with low-level internal echoes in the left ovary, possibly representing an endometrioma or hemorrhagic cyst. Follow-up pelvic ultrasound is recommended in 6 weeks to assess for resolution/ stability. 3. Small benign-appearing cysts in the right ovary measuring up to 3.7 cm.   Electronically Signed   By: Allen  Grady M.D.   On: 11/30/2014 20:10   Us Pelvis Complete  11/30/2014   CLINICAL DATA:  Abdominal pain.  History of right ovarian cyst.  EXAM: TRANSABDOMINAL AND TRANSVAGINAL ULTRASOUND OF PELVIS  DOPPLER ULTRASOUND OF OVARIES  TECHNIQUE: Both  transabdominal and transvaginal ultrasound examinations of the pelvis were performed. Transabdominal technique was performed for global imaging of the pelvis including uterus, ovaries, adnexal regions, and pelvic cul-de-sac.  It was necessary to proceed with endovaginal exam following the transabdominal exam to visualize the ovaries. Color and duplex Doppler ultrasound was utilized to evaluate blood flow to the ovaries.  COMPARISON:  CT abdomen and pelvis 01/04/2012. Pelvic ultrasound 04/07/2011.  FINDINGS: Uterus  Measurements: 8.0 x 3.6 x 3.7 cm. No fibroids or other mass visualized.  Endometrium  Thickness: 6.5 mm.  No focal abnormality visualized.  Right ovary  Measurements: 6.4 x 3.0 x 3.3 cm. A simple appearing cyst measures 3.7 x 2.7 x 3.5 cm, mildly larger than the 2.9 cm cyst on the 2013 study but without evidence of solid component. This could represent a new cyst versus mild enlargement of the pre-existing cyst. A separate, smaller cyst measures 2.2 x 1.3 x 1.8 cm.  Left ovary  Measurements: 8.2 x 6.3 x 7.1 cm. 7.8 x 5.8 x 6.9 cm hypoechoic lesion/cyst with diffuse low level internal echoes/ debris and no evidence of internal vascularity.  Pulsed Doppler evaluation of both ovaries demonstrates normal low-resistance arterial and venous waveforms.  Other findings  Small amount of free fluid in the cul-de-sac.  IMPRESSION: 1. No evidence of ovarian torsion. 2.   7.8 cm lesion with low-level internal echoes in the left ovary, possibly representing an endometrioma or hemorrhagic cyst. Follow-up pelvic ultrasound is recommended in 6 weeks to assess for resolution/ stability. 3. Small benign-appearing cysts in the right ovary measuring up to 3.7 cm.   Electronically Signed   By: Allen  Grady M.D.   On: 11/30/2014 20:10   Us Art/ven Flow Abd Pelv Doppler  12/04/2014   CLINICAL DATA:  Left ovarian cyst, rule out torsion or rupture.  EXAM: TRANSVAGINAL ULTRASOUND OF PELVIS  DOPPLER ULTRASOUND OF OVARIES   TECHNIQUE: Transvaginal ultrasound examination of the pelvis was performed including evaluation of the uterus, ovaries, adnexal regions, and pelvic cul-de-sac.  Color and duplex Doppler ultrasound was utilized to evaluate blood flow to the ovaries.  COMPARISON:  Four days ago  FINDINGS: Uterus  Measurements: 8 x 3 x 4 cm. No fibroids or other mass visualized.  Endometrium  Thickness: 7 mm. The fundic portion of the endometrial cavity is anteriorly displaced, but there is no underlying mass/ fibroid.  Right ovary  Measurements: 57 x 35 x 42 mm. A simple 36 mm cyst is noted, likely follicular.  Left ovary  Measurements: 77 x 60 x 69 mm. Re- identified complex cystic mass measuring 72 x 54 x 66 mm, likely stable when allowing for differences in measurement. Internal mid level echoes and peripheral nodularity present. Only some of the peripheral nodular are echogenic. The largest nodule is 5 mm.  Pulsed Doppler evaluation demonstrates normal low-resistance arterial and venous waveforms in both ovaries.  Small free fluid in the left pelvis.  IMPRESSION: 1. No acute finding.  Normal ovarian blood flow. 2. 72 mm complex left ovarian cyst with mural nodules. Endometrioma or epithelial neoplasm could have this appearance. 3. Simple right ovarian cyst measuring 36 mm. 4. Small simple pelvic fluid.   Electronically Signed   By: Jonathon  Watts M.D.   On: 12/04/2014 15:32   Us Art/ven Flow Abd Pelv Doppler  11/30/2014   CLINICAL DATA:  Abdominal pain.  History of right ovarian cyst.  EXAM: TRANSABDOMINAL AND TRANSVAGINAL ULTRASOUND OF PELVIS  DOPPLER ULTRASOUND OF OVARIES  TECHNIQUE: Both transabdominal and transvaginal ultrasound examinations of the pelvis were performed. Transabdominal technique was performed for global imaging of the pelvis including uterus, ovaries, adnexal regions, and pelvic cul-de-sac.  It was necessary to proceed with endovaginal exam following the transabdominal exam to visualize the ovaries. Color  and duplex Doppler ultrasound was utilized to evaluate blood flow to the ovaries.  COMPARISON:  CT abdomen and pelvis 01/04/2012. Pelvic ultrasound 04/07/2011.  FINDINGS: Uterus  Measurements: 8.0 x 3.6 x 3.7 cm. No fibroids or other mass visualized.  Endometrium  Thickness: 6.5 mm.  No focal abnormality visualized.  Right ovary  Measurements: 6.4 x 3.0 x 3.3 cm. A simple appearing cyst measures 3.7 x 2.7 x 3.5 cm, mildly larger than the 2.9 cm cyst on the 2013 study but without evidence of solid component. This could represent a new cyst versus mild enlargement of the pre-existing cyst. A separate, smaller cyst measures 2.2 x 1.3 x 1.8 cm.  Left ovary  Measurements: 8.2 x 6.3 x 7.1 cm. 7.8 x 5.8 x 6.9 cm hypoechoic lesion/cyst with diffuse low level internal echoes/ debris and no evidence of internal vascularity.  Pulsed Doppler evaluation of both ovaries demonstrates normal low-resistance arterial and venous waveforms.  Other findings  Small amount of free fluid in the cul-de-sac.  IMPRESSION: 1. No evidence of ovarian torsion. 2. 7.8 cm   lesion with low-level internal echoes in the left ovary, possibly representing an endometrioma or hemorrhagic cyst. Follow-up pelvic ultrasound is recommended in 6 weeks to assess for resolution/ stability. 3. Small benign-appearing cysts in the right ovary measuring up to 3.7 cm.   Electronically Signed   By: Allen  Grady M.D.   On: 11/30/2014 20:10    Assessment & Plan:  Counseled patient about needing laparoscopic ovarian cystectomy, possible left salpingooophorectomy.  Patient agreed to plan after counseling; surgery scheduled 12/26/14 at 0930.  Routine preoperative instructions of having nothing to eat or drink after midnight on the day prior to surgery and also coming to the hospital 1.5 hours prior to her time of surgery were also emphasized.  Vicodin given to patient for pain.   Please refer to After Visit Summary for other counseling recommendations.     UGONNA   ANYANWU, MD, FACOG Attending Obstetrician & Gynecologist,  [facility]  

## 2014-12-26 NOTE — Anesthesia Preprocedure Evaluation (Addendum)
Anesthesia Evaluation  Patient identified by MRN, date of birth, ID band Patient awake    Reviewed: Allergy & Precautions, NPO status , Patient's Chart, lab work & pertinent test results  Airway Mallampati: I  TM Distance: >3 FB Neck ROM: Full    Dental no notable dental hx. (+) Teeth Intact   Pulmonary asthma ,    Pulmonary exam normal breath sounds clear to auscultation       Cardiovascular negative cardio ROS Normal cardiovascular exam Rhythm:Regular Rate:Normal     Neuro/Psych  Headaches, negative psych ROS   GI/Hepatic negative GI ROS, Neg liver ROS,   Endo/Other  negative endocrine ROS  Renal/GU negative Renal ROS  negative genitourinary   Musculoskeletal negative musculoskeletal ROS (+)   Abdominal   Peds  Hematology negative hematology ROS (+)   Anesthesia Other Findings   Reproductive/Obstetrics AUB Left Ovarian Cyst                            Anesthesia Physical Anesthesia Plan  ASA: II  Anesthesia Plan: General   Post-op Pain Management:    Induction: Intravenous  Airway Management Planned: Oral ETT  Additional Equipment:   Intra-op Plan:   Post-operative Plan: Extubation in OR  Informed Consent: I have reviewed the patients History and Physical, chart, labs and discussed the procedure including the risks, benefits and alternatives for the proposed anesthesia with the patient or authorized representative who has indicated his/her understanding and acceptance.   Dental advisory given  Plan Discussed with: CRNA, Anesthesiologist and Surgeon  Anesthesia Plan Comments:         Anesthesia Quick Evaluation

## 2014-12-26 NOTE — Op Note (Signed)
Tanya Holland PROCEDURE DATE: 12/26/2014  PREOPERATIVE DIAGNOSES: Left ovarian complex cyst, right ovarian simple cyst POSTOPERATIVE DIAGNOSES: The same, PROCEDURE: Laparoscopic left partial oophorectomy; drainage of right ovarian cyst. SURGEON:  Dr. Jaynie Collins ASSISTANT: Dr. Tinnie Gens ANESTHESIOLOGIST: Dr. Mal Amabile  INDICATIONS: 34 y.o. G0P0000 with aforementioned preoperative diagnoses here today for surgical management.   Risks of surgery were discussed with the patient including but not limited to: bleeding which may require transfusion or reoperation; infection which may require antibiotics; injury to bowel, bladder, ureters or other surrounding organs; need for additional procedures including laparotomy; thromboembolic phenomenon, incisional problems and other postoperative/anesthesia complications. Written informed consent was obtained.    FINDINGS:  Small uterus, left adnexa with enlarged 8 cm left ovary.  Normal right adnexa with 3 cm simple cyst that was drained.  No evidence of endometriosis, but had a few omental adhesions around umbilicus.  No other abdominal/pelvic abnormality.  Normal upper abdomen.  ANESTHESIA:    General INTRAVENOUS FLUIDS: 2000 ml ESTIMATED BLOOD LOSS: 25 ml SPECIMENS: Portion of left ovary and cyst fluid COMPLICATIONS: None immediate   PROCEDURE IN DETAIL:  The patient received intravenous antibiotics and had sequential compression devices applied to her lower extremities while in the preoperative area.  She was then taken to the operating room where general anesthesia was administered and was found to be adequate.  She was placed in the dorsal lithotomy position, and was prepped and draped in a sterile manner.   A Foley catheter was inserted into her bladder and attached to constant drainage and a uterine manipulator was then advanced into the uterus.   After an adequate timeout was performed, attention was then turned to the patient's abdomen  where a 11-mm skin incision was made in the umbilical fold.  The Optiview 11-mm trocar and sleeve were then advanced without difficulty with the laparoscope under direct visualization into the abdomen.  The abdomen was then insufflated with carbon dioxide gas.  Adequate pneumoperitoneum was obtained.  A survey of the patient's pelvis and abdomen revealed the findings above. Bilateral 5-mm lower quadrant ports were then placed under direct visualization.  On the left side,  an incision was made into the left ovary with the Harmonic device.  The contents of the cyst, which was a thick yellow gelatinous fluid was aspirated using the Nezhat irrigator. Some of the cyst fluid spilled into the pelvis which was copiously irrigated.  An attempt was then made to remove the cyst form the ovary but this was futile and the cyst was densely adherent to the ovarian tissue.  A portion of the redundant ovarian tissue was then excised with the Harmonic scalpel and sent for pathology. Hemostasis was obtained using the Harmonic; Surgicel was also placed into the defect in the ovary.  A small incision was made into the right ovary and light brown fluid was expressed.  Excellent hemostasis was noted. The specimen was then removed from the abdomen through the 11-mm port under direct visualization.  The operative site was surveyed, and it was found to be hemostatic.  No intraoperative injury to other surrounding organs was noted.  The abdomen was desufflated and all instruments were then removed from the patient's abdomen. The fascial incision of the umbilicus was closed with a 0 Vicryl figure of eight stitch.  All skin incisions were closed with 4-0 Vicryl subcuticular stitches and Dermabond.   The patient will be discharged to home as per PACU criteria.  Routine postoperative instructions given.  She was  prescribed Percocet, Ibuprofen and Colace.  She will follow up in the clinic on 01/30/15  for postoperative evaluation.   Jaynie Collins, MD, FACOG Attending Obstetrician & Gynecologist, [facility], [facility] [employer]

## 2014-12-26 NOTE — Anesthesia Procedure Notes (Signed)
Procedure Name: Intubation Date/Time: 12/26/2014 9:35 AM Performed by: Graciela Husbands Pre-anesthesia Checklist: Suction available, Emergency Drugs available, Timeout performed, Patient identified and Patient being monitored Patient Re-evaluated:Patient Re-evaluated prior to inductionOxygen Delivery Method: Circle system utilized Preoxygenation: Pre-oxygenation with 100% oxygen Intubation Type: IV induction Ventilation: Mask ventilation without difficulty Laryngoscope Size: Mac and 3 Grade View: Grade I Tube size: 7.0 mm Number of attempts: 1 Airway Equipment and Method: Stylet Placement Confirmation: ETT inserted through vocal cords under direct vision,  positive ETCO2 and breath sounds checked- equal and bilateral Secured at: 20 cm Tube secured with: Tape Dental Injury: Teeth and Oropharynx as per pre-operative assessment

## 2014-12-26 NOTE — Interval H&P Note (Signed)
History and Physical Interval Note 12/26/2014 8:51 AM  Tanya Holland  has presented today for surgery, with the diagnosis of COMPLEX LEFT OVARIAN CYST and is here for LAPAROSCOPIC LEFT OVARIAN CYSTECTOMY, POSSIBLE LEFT SALPINGO OOPHORECTOMY as a surgical intervention.  The risks of surgery were discussed in detail with the patient including but not limited to: bleeding which may require transfusion or reoperation; infection which may require prolonged hospitalization or re-hospitalization and antibiotic therapy; injury to bowel, bladder, ureters and major vessels or other surrounding organs; need for additional procedures including laparotomy; thromboembolic phenomenon, incisional problems and other postoperative or anesthesia complications.  Patient was told that the likelihood that her condition and symptoms will be treated effectively with this surgical management was very high; the postoperative expectations were also discussed in detail. The patient also understands the alternative treatment options which were discussed in full. All questions were answered.  After consideration of risks, benefits and other options for treatment, the patient has consented to surgery.  The patient's history has been reviewed, patient examined, no change in status, stable for surgery.  I have reviewed the patient's chart and labs.  Questions were answered to the patient's satisfaction.     Jaynie Collins, MD, FACOG Attending Obstetrician & Gynecologist, [facility], [facility] [employer]

## 2014-12-26 NOTE — Anesthesia Postprocedure Evaluation (Signed)
  Anesthesia Post-op Note  Patient: Tanya Holland  Procedure(s) Performed: Procedure(s): laporoscopic  partial left oophorectomy. drainage of right ovarian cyst (Left)  Patient Location: PACU  Anesthesia Type:General  Level of Consciousness: awake, alert  and oriented  Airway and Oxygen Therapy: Patient Spontanous Breathing  Post-op Pain: none  Post-op Assessment: Post-op Vital signs reviewed, Patient's Cardiovascular Status Stable, Respiratory Function Stable, Patent Airway, No signs of Nausea or vomiting and Pain level controlled              Post-op Vital Signs: Reviewed and stable  Last Vitals:  Filed Vitals:   12/26/14 1215  BP: 117/78  Pulse: 78  Temp:   Resp: 13    Complications: No apparent anesthesia complications

## 2014-12-27 ENCOUNTER — Encounter (HOSPITAL_COMMUNITY): Payer: Self-pay | Admitting: Obstetrics & Gynecology

## 2014-12-27 MED FILL — Heparin Sodium (Porcine) Inj 5000 Unit/ML: INTRAMUSCULAR | Qty: 1 | Status: AC

## 2014-12-30 ENCOUNTER — Telehealth: Payer: Self-pay | Admitting: *Deleted

## 2014-12-30 NOTE — Telephone Encounter (Signed)
Patient called stating she had surgery on Thursday 10/6 and wanted to know if she could get a note for work stating when she could return. Also wanted to know about when her honeycomb dressing could come off. Patient has a note waiting on her here at the clinic, informed her someone would need to pick it up. Advised to leave the honeycomb on a couple of more days unless it becomes wet underneath. Patient voiced understanding.

## 2014-12-31 ENCOUNTER — Telehealth: Payer: Self-pay | Admitting: General Practice

## 2014-12-31 NOTE — Telephone Encounter (Signed)
Per Dr Macon Large, patient's cyst was normal. It is recommended she start OCPs to help with ovarian cyst suppression. Patient can start this before follow up appt on 11/10 otherwise it will be discussed at length during her visit. Called patient and informed her of results and recommendations. Patient verbalized understanding and states that underneath the honeycomb dressing it looks yellowish/greenish. Told patient that it is hard to tell how her incision looks through the dressing. Told patient when she takes the dressing off tomorrow to gently clean the area and dry it well. Told patient after that if incision still looks bad call us back. Patient verbalized understanding and asked about return to work letter. Told patient the letter is completed she just needs to come by and pick it up. Patient verbalized understanding and had no questions

## 2015-01-01 ENCOUNTER — Encounter: Payer: Self-pay | Admitting: General Practice

## 2015-01-01 ENCOUNTER — Ambulatory Visit: Payer: Self-pay | Admitting: Obstetrics & Gynecology

## 2015-01-02 ENCOUNTER — Telehealth: Payer: Self-pay | Admitting: *Deleted

## 2015-01-02 NOTE — Telephone Encounter (Signed)
Tanya HodgkinsRekenya called, had surgery 12/26/14 and states her wound is leaking. States she took the dressing off yesterday and noted some old brownish on the dressing. States since then has noted a small amount of clear, sticky drainage. I had her look at her incisions with a mirror and she denies any redness, wound opening, or any other discharge or tenderness, denies fever. I explained can be normal to have some serous discharge but if she has fever, or discharge changes color or has bad smell or is draining a larger amount, or any concerns to call back and make appointment to be seen in clinic- if after hours- go to MAU. She voices understanding.

## 2015-01-05 ENCOUNTER — Inpatient Hospital Stay (HOSPITAL_COMMUNITY)
Admission: AD | Admit: 2015-01-05 | Discharge: 2015-01-05 | Disposition: A | Discharge status: Home / Self Care | Payer: BLUE CROSS/BLUE SHIELD | Source: Ambulatory Visit | Attending: Obstetrics & Gynecology | Admitting: Obstetrics & Gynecology

## 2015-01-05 ENCOUNTER — Encounter (HOSPITAL_COMMUNITY): Payer: Self-pay | Admitting: *Deleted

## 2015-01-05 DIAGNOSIS — T7840XA Allergy, unspecified, initial encounter: Secondary | ICD-10-CM

## 2015-01-05 DIAGNOSIS — T814XXA Infection following a procedure, initial encounter: Secondary | ICD-10-CM | POA: Insufficient documentation

## 2015-01-05 DIAGNOSIS — T8149XA Infection following a procedure, other surgical site, initial encounter: Secondary | ICD-10-CM | POA: Diagnosis present

## 2015-01-05 DIAGNOSIS — L03311 Cellulitis of abdominal wall: Secondary | ICD-10-CM | POA: Diagnosis not present

## 2015-01-05 DIAGNOSIS — T402X5A Adverse effect of other opioids, initial encounter: Secondary | ICD-10-CM

## 2015-01-05 DIAGNOSIS — IMO0001 Reserved for inherently not codable concepts without codable children: Secondary | ICD-10-CM

## 2015-01-05 DIAGNOSIS — R1030 Lower abdominal pain, unspecified: Secondary | ICD-10-CM | POA: Diagnosis present

## 2015-01-05 LAB — CBC WITH DIFFERENTIAL/PLATELET
BASOS PCT: 0 %
Basophils Absolute: 0 10*3/uL (ref 0.0–0.1)
EOS ABS: 0.4 10*3/uL (ref 0.0–0.7)
EOS PCT: 5 %
HCT: 35.4 % — ABNORMAL LOW (ref 36.0–46.0)
HEMOGLOBIN: 11.5 g/dL — AB (ref 12.0–15.0)
Lymphocytes Relative: 19 %
Lymphs Abs: 1.7 10*3/uL (ref 0.7–4.0)
MCH: 27.4 pg (ref 26.0–34.0)
MCHC: 32.5 g/dL (ref 30.0–36.0)
MCV: 84.3 fL (ref 78.0–100.0)
MONOS PCT: 6 %
Monocytes Absolute: 0.6 10*3/uL (ref 0.1–1.0)
NEUTROS PCT: 70 %
Neutro Abs: 6.2 10*3/uL (ref 1.7–7.7)
PLATELETS: 336 10*3/uL (ref 150–400)
RBC: 4.2 MIL/uL (ref 3.87–5.11)
RDW: 13.7 % (ref 11.5–15.5)
WBC: 8.9 10*3/uL (ref 4.0–10.5)

## 2015-01-05 MED ORDER — LORATADINE 10 MG PO TABS
10.0000 mg | ORAL_TABLET | Freq: Once | ORAL | Status: AC
  Administered 2015-01-05: 10 mg via ORAL
  Filled 2015-01-05: qty 1

## 2015-01-05 MED ORDER — DEXAMETHASONE SODIUM PHOSPHATE 10 MG/ML IJ SOLN
10.0000 mg | Freq: Once | INTRAMUSCULAR | Status: AC
  Administered 2015-01-05: 10 mg via INTRAMUSCULAR
  Filled 2015-01-05: qty 1

## 2015-01-05 MED ORDER — SULFAMETHOXAZOLE-TRIMETHOPRIM 800-160 MG PO TABS
1.0000 | ORAL_TABLET | Freq: Two times a day (BID) | ORAL | Status: DC
Start: 2015-01-05 — End: 2015-01-30

## 2015-01-05 MED ORDER — HYDROMORPHONE HCL 1 MG/ML IJ SOLN
1.0000 mg | Freq: Once | INTRAMUSCULAR | Status: AC
  Administered 2015-01-05: 1 mg via INTRAMUSCULAR
  Filled 2015-01-05: qty 1

## 2015-01-05 MED ORDER — OXYCODONE-ACETAMINOPHEN 5-325 MG PO TABS: 1.0000 | ORAL_TABLET | ORAL | Status: AC | PRN

## 2015-01-05 MED ORDER — SULFAMETHOXAZOLE-TRIMETHOPRIM 800-160 MG PO TABS
1.0000 | ORAL_TABLET | Freq: Once | ORAL | Status: AC
  Administered 2015-01-05: 1 via ORAL
  Filled 2015-01-05 (×2): qty 1

## 2015-01-05 NOTE — MAU Note (Addendum)
Patient presents post surgery from 12/26/14 to have cysts removed from both ovaries with c/o bilateral lower flank pain, right groin pain and rash on abdomen and umbilicus. Denies vaginal bleeding or discharge but states there is a yellowish-green drainage from her umbilicus.

## 2015-01-05 NOTE — MAU Provider Note (Signed)
History     CSN: [id number]  Arrival date and time: 01/05/15 1252   First Provider Initiated Contact with Patient 01/05/15 1448      Chief Complaint  Patient presents with  . Flank Pain  . Rash   This is a 34 y.o. female who is about 10 days postoperative from a laparoscopic drainage of a right ovarian cyst.   States she has had drainage from the incisions since surgery.  STates there is redness and pain at all of the incisions. The umbilical one is the most inflamed  Also has an itchy rash on bilateral flanks. Also complains of a painful knot on her right groin.    Wound Check She was originally treated 10 to 14 days ago. Prior ED Treatment: Laparoscopy. Her temperature was unmeasured prior to arrival. There has been colored discharge from the wound. The redness has worsened. The swelling has worsened. The pain has worsened. There is difficulty moving the extremity or digit due to pain.  RN Note: Patient presents post surgery from 12/26/14 to have cysts removed from both ovaries with c/o bilateral lower flank pain, right groin pain and rash on abdomen and umbilicus. Denies vaginal bleeding or discharge but states there is a yellowish-green drainage from her umbilicus  OB History    Gravida Para Term Preterm AB TAB SAB Ectopic Multiple Living        Past Medical History  Diagnosis Date  . Asthma   . Migraines   . Ovarian cyst, right     2 surgeries  . Pneumothorax     Past Surgical History  Procedure Laterality Date  . Ovarian cyst surgery  2006 & 2009  . Salpingoophorectomy Left 12/26/2014    Procedure: laporoscopic  partial left oophorectomy. drainage of right ovarian cyst;  Surgeon: Tereso Newcomer, MD;  Location: [facility];  Service: Gynecology;  Laterality: Left;    Family History  Problem Relation Age of Onset  . Anesthesia problems Neg Hx   . Hypotension Neg Hx   . Malignant hyperthermia Neg Hx   . Pseudochol deficiency Neg Hx   .  Hypertension Father   . Asthma Father   . Cancer Father     prostate  . Hypertension Mother   . Bronchitis Mother     Social History  Substance Use Topics  . Smoking status: Never Smoker   . Smokeless tobacco: Never Used  . Alcohol Use: Yes     Comment: occasionally     Allergies:  Allergies  Allergen Reactions  . Chocolate Hives, Itching and Swelling    Tongue swells  . Morphine And Related Rash    Hallucinations    Prescriptions prior to admission  Medication Sig Dispense Refill Last Dose  . HYDROcodone-acetaminophen (NORCO/VICODIN) 5-325 MG tablet Take 1-2 tablets by mouth every 6 (six) hours as needed for moderate pain. 60 tablet 0 01/05/2015 at Unknown time  . hydrOXYzine (VISTARIL) 25 MG capsule Take 1 capsule (25 mg total) by mouth 3 (three) times daily as needed. 30 capsule 0 01/04/2015 at Unknown time  . ibuprofen (ADVIL,MOTRIN) 800 MG tablet Take 1 tablet (800 mg total) by mouth every 8 (eight) hours as needed for moderate pain. 60 tablet 3 01/05/2015 at Unknown time  . albuterol (PROVENTIL HFA;VENTOLIN HFA) 108 (90 BASE) MCG/ACT inhaler Inhale 2 puffs into the lungs every 6 (six) hours as needed for wheezing or shortness of breath. For shortness of breath  prn  . albuterol (PROVENTIL) (2.5 MG/3ML) 0.083% nebulizer solution Take 3 mLs (2.5 mg total) by nebulization every 4 (four) hours as needed for wheezing or shortness of breath. 30 vial 0 prn  . docusate sodium (COLACE) 100 MG capsule Take 1 capsule (100 mg total) by mouth 2 (two) times daily as needed. (Patient not taking: Reported on 01/05/2015) 30 capsule 2 Not Taking at Unknown time   Medical, Surgical, Family and Social histories reviewed and are listed above.  Medications and allergies reviewed.  Review of Systems  Constitutional: Negative for fever, chills and malaise/fatigue.  Gastrointestinal: Negative for nausea, vomiting, abdominal pain, diarrhea and constipation.       Three surgical wounds are  draining green fluid, painful and reddened.  Neurological: Negative for headaches.  Other systems negative  Physical Exam   Blood pressure 133/83, pulse 85, temperature 98.3 F (36.8 C), temperature source Oral, resp. rate 16, height 4\' 10"  (1.473 m), weight 126 lb 8 oz (57.38 kg), last menstrual period 12/11/2014.  Physical Exam  Constitutional: She is oriented to person, place, and time. She appears well-developed and well-nourished. No distress.  HENT:  Head: Normocephalic.  Cardiovascular: Normal rate, regular rhythm and normal heart sounds.   Respiratory: Effort normal. No respiratory distress. She has no wheezes. She has no rales.  GI: Soft. She exhibits no distension. There is tenderness. There is guarding (due to pain). There is no rebound.  1cm lymph node on right groin Three incisions, umbilical, LLQ, RLQ Bilateral incisions with erethema and crusting Umbilical incision draining yellow/green fluid Erethema surrounds umbilicus (see photos)  Musculoskeletal: Normal range of motion.  Neurological: She is alert and oriented to person, place, and time.  Skin: Skin is warm and dry.  Psychiatric: She has a normal mood and affect.          MAU Course  Procedures  MDM Dr Macon LargeAnyanwu consulted (she was surgeon).  She came to see the patient. Will start on antibiotics Dilaudid given for pain. Patient states even though she is allergic to Morphine, she has had Dilaudid before with no problems. After getting shot, she developed hives on her abdomen and back with itching Given Claritin and a shot of Dexamethasone due to hx of allergy to Morphine. Discussed she is now considered allergic to Dilaudid  Assessment and Plan  A:  10 days PostOperative from Laparoscopy       Incisional cellulitis       Allergic reaction to Dilaudid  P:  Discharge home       Rx Septra DS bid x 10 days       Dr Macon LargeAnyanwu will see her in clinic in 3 days.       Come back if worsens or develops  fever.  Wynelle BourgeoisWILLIAMS,Dellene Mcgroarty 01/05/2015, 3:10 PM

## 2015-01-05 NOTE — Discharge Instructions (Signed)
Hives Hives are itchy, red, swollen areas of the skin. They can vary in size and location on your body. Hives can come and go for hours or several days (acute hives) or for several weeks (chronic hives). Hives do not spread from person to person (noncontagious). They may get worse with scratching, exercise, and emotional stress. CAUSES   Allergic reaction to food, additives, or drugs.  Infections, including the common cold.  Illness, such as vasculitis, lupus, or thyroid disease.  Exposure to sunlight, heat, or cold.  Exercise.  Stress.  Contact with chemicals. SYMPTOMS   Red or white swollen patches on the skin. The patches may change size, shape, and location quickly and repeatedly.  Itching.  Swelling of the hands, feet, and face. This may occur if hives develop deeper in the skin. DIAGNOSIS  Your caregiver can usually tell what is wrong by performing a physical exam. Skin or blood tests may also be done to determine the cause of your hives. In some cases, the cause cannot be determined. TREATMENT  Mild cases usually get better with medicines such as antihistamines. Severe cases may require an emergency epinephrine injection. If the cause of your hives is known, treatment includes avoiding that trigger.  HOME CARE INSTRUCTIONS   Avoid causes that trigger your hives.  Take antihistamines as directed by your caregiver to reduce the severity of your hives. Non-sedating or low-sedating antihistamines are usually recommended. Do not drive while taking an antihistamine.  Take any other medicines prescribed for itching as directed by your caregiver.  Wear loose-fitting clothing.  Keep all follow-up appointments as directed by your caregiver. SEEK MEDICAL CARE IF:   You have persistent or severe itching that is not relieved with medicine.  You have painful or swollen joints. SEEK IMMEDIATE MEDICAL CARE IF:   You have a fever.  Your tongue or lips are swollen.  You have  trouble breathing or swallowing.  You feel tightness in the throat or chest.  You have abdominal pain. These problems may be the first sign of a life-threatening allergic reaction. Call your local emergency services (911 in [place] MAKE SURE YOU:   Understand these instructions.  Will watch your condition.  Will get help right away if you are not doing well or get worse.   This information is not intended to replace advice given to you by your health care provider. Make sure you discuss any questions you have with your health care provider.   Document Released: 03/08/2005 Document Revised: 03/13/2013 Document Reviewed: 06/01/2011 Elsevier Interactive Patient Education 2016 [employer] Wound Infection A wound infection happens when a type of germ (bacteria) starts growing in the wound. In some cases, this can cause the wound to break open. If cared for properly, the infected wound will heal from the inside to the outside. Wound infections need treatment. CAUSES An infection is caused by bacteria growing in the wound.  SYMPTOMS   Increase in redness, swelling, or pain at the wound site.  Increase in drainage at the wound site.  Wound or bandage (dressing) starts to smell bad.  Fever.  Feeling tired or fatigued.  Pus draining from the wound. TREATMENT  Your health care provider will prescribe antibiotic medicine. The wound infection should improve within 24 to 48 hours. Any redness around the wound should stop spreading and the wound should be less painful.  HOME CARE INSTRUCTIONS   Only take over-the-counter or prescription medicines for pain, discomfort, or fever as directed by your health care  provider.  Take your antibiotics as directed. Finish them even if you start to feel better.  Gently wash the area with mild soap and water 2 times a day, or as directed. Rinse off the soap. Pat the area dry with a clean towel. Do not rub the wound. This may cause bleeding.  Follow  your health care provider's instructions for how often you need to change the dressing.  Apply ointment and a dressing to the wound as directed.  If the dressing sticks, moisten it with soapy water and gently remove it.  Change the bandage right away if it becomes wet, dirty, or develops a bad smell.  Take showers. Do not take tub baths, swim, or do anything that may soak the wound until it is healed.  Avoid exercises that make you sweat heavily.  Use anti-itch medicine as directed by your health care provider. The wound may itch when it is healing. Do not pick or scratch at the wound.  Follow up with your health care provider to get your wound rechecked as directed. SEEK MEDICAL CARE IF:  You have an increase in swelling, pain, or redness around the wound.  You have an increase in the amount of pus coming from the wound.  There is a bad smell coming from the wound.  More of the wound breaks open.  You have a fever. MAKE SURE YOU:   Understand these instructions.  Will watch your condition.  Will get help right away if you are not doing well or get worse.   This information is not intended to replace advice given to you by your health care provider. Make sure you discuss any questions you have with your health care provider.   Document Released: 12/05/2002 Document Revised: 03/13/2013 Document Reviewed: 08/26/2014 Elsevier Interactive Patient Education [employer]

## 2015-01-07 ENCOUNTER — Encounter (HOSPITAL_COMMUNITY): Payer: Self-pay | Admitting: Advanced Practice Midwife

## 2015-01-08 ENCOUNTER — Ambulatory Visit (INDEPENDENT_AMBULATORY_CARE_PROVIDER_SITE_OTHER): Payer: BLUE CROSS/BLUE SHIELD | Admitting: Obstetrics & Gynecology

## 2015-01-08 ENCOUNTER — Encounter: Payer: Self-pay | Admitting: Obstetrics & Gynecology

## 2015-01-08 VITALS — BP 119/72 | HR 74 | Resp 16 | Ht <= 58 in | Wt 125.0 lb

## 2015-01-08 DIAGNOSIS — T798XXD Other early complications of trauma, subsequent encounter: Secondary | ICD-10-CM

## 2015-01-08 DIAGNOSIS — L509 Urticaria, unspecified: Secondary | ICD-10-CM

## 2015-01-08 MED ORDER — PREDNISONE 20 MG PO TABS: 60.0000 mg | ORAL_TABLET | Freq: Every day | ORAL | Status: DC

## 2015-01-08 NOTE — Progress Notes (Signed)
   CLINIC ENCOUNTER NOTE  History:  34 y.o. G0P0000 here today for follow up of incisional cellulitis diagnosed during MAU visit on 01/05/15. She reports improved pain and symptoms, but has persistent urticaria that was noted during that visit. She reports it getting better after stopping Hydrocodone.  She denies any abnormal vaginal discharge, bleeding, pelvic pain or other concerns.   Past Medical History  Diagnosis Date  . Asthma   . Migraines   . Ovarian cyst, right     2 surgeries  . Pneumothorax     Past Surgical History  Procedure Laterality Date  . Ovarian cyst surgery  2006 & 2009  . Salpingoophorectomy Left 12/26/2014    Procedure: laporoscopic  partial left oophorectomy. drainage of right ovarian cyst;  Surgeon: Tereso NewcomerUgonna A Patrycja Mumpower, MD;  Location: [facility];  Service: Gynecology;  Laterality: Left;    The following portions of the patient's history were reviewed and updated as appropriate: allergies, current medications, past family history, past medical history, past social history, past surgical history and problem list.   Review of Systems:  Pertinent items noted in HPI and remainder of comprehensive ROS otherwise negative.  Objective:  Physical Exam BP 119/72 mmHg  Pulse 74  Resp 16  Ht 4\' 10"  (1.473 m)  Wt 125 lb (56.7 kg)  BMI 26.13 kg/m2  LMP 12/11/2014 (Exact Date) CONSTITUTIONAL: Well-developed, well-nourished female in no acute distress.  HENT:  Normocephalic, atraumatic. External right and left ear normal. Oropharynx is clear and moist EYES: Conjunctivae and EOM are normal. Pupils are equal, round, and reactive to light. No scleral icterus.  NECK: Normal range of motion, supple, no masses SKIN: Skin is warm and dry. Diffuse erythematous macules/hives noted on abdomen. Not diaphoretic. No erythema. No pallor. NEUROLGIC: Alert and oriented to person, place, and time. Normal reflexes, muscle tone coordination. No cranial nerve deficit noted. PSYCHIATRIC: Normal  mood and affect. Normal behavior. Normal judgment and thought content. CARDIOVASCULAR: Normal heart rate noted RESPIRATORY: Effort and breath sounds normal, no problems with respiration noted ABDOMEN: Soft, no distention noted.  Laparoscopic Incisions are healing well, much improved compared to previous MAU encounter. No erythema. Dermabond removed from umbilical incision and base of incision treated with silver nitrate.  PELVIC: Deferred MUSCULOSKELETAL: Normal range of motion. No edema noted.  Assessment & Plan:  1. Wound infection, subsequent encounter Continue Bactrim, healing well.  2. Urticaria Unsure of etiology, ?Percocet. Advised to take NSAIDs. - predniSONE (DELTASONE) 20 MG tablet; Take 3 tablets (60 mg total) by mouth daily with breakfast.  Dispense: 15 tablet; Refill: 1   Follow up on 01/30/15 as scheduled   Tanya Holland  Tanya Almeda, MD, FACOG Attending Obstetrician & Gynecologist, [facility]

## 2015-01-30 ENCOUNTER — Ambulatory Visit (INDEPENDENT_AMBULATORY_CARE_PROVIDER_SITE_OTHER): Payer: BLUE CROSS/BLUE SHIELD | Admitting: Obstetrics & Gynecology

## 2015-01-30 ENCOUNTER — Encounter: Payer: Self-pay | Admitting: Obstetrics & Gynecology

## 2015-01-30 VITALS — BP 121/79 | HR 84 | Temp 98.4°F | Ht <= 58 in | Wt 122.5 lb

## 2015-01-30 DIAGNOSIS — Z9889 Other specified postprocedural states: Secondary | ICD-10-CM

## 2015-01-30 DIAGNOSIS — IMO0001 Reserved for inherently not codable concepts without codable children: Secondary | ICD-10-CM

## 2015-01-30 DIAGNOSIS — F419 Anxiety disorder, unspecified: Secondary | ICD-10-CM

## 2015-01-30 DIAGNOSIS — T814XXD Infection following a procedure, subsequent encounter: Principal | ICD-10-CM

## 2015-01-30 MED ORDER — IBUPROFEN 800 MG PO TABS: 800.0000 mg | ORAL_TABLET | Freq: Three times a day (TID) | ORAL | Status: AC | PRN

## 2015-01-30 MED ORDER — HYDROXYZINE PAMOATE 25 MG PO CAPS: 25.0000 mg | ORAL_CAPSULE | Freq: Four times a day (QID) | ORAL | Status: AC | PRN

## 2015-01-30 NOTE — Progress Notes (Signed)
   CLINIC ENCOUNTER NOTE  History:  34 y.o. G0P0000 here today for follow up of incisional cellulitis. Reports it has improved significantly. She denies any abnormal vaginal discharge, bleeding, pelvic pain or other concerns.   Past Medical History  Diagnosis Date  . Asthma   . Migraines   . Ovarian cyst, right     2 surgeries  . Pneumothorax     Past Surgical History  Procedure Laterality Date  . Ovarian cyst surgery  2006 & 2009  . Salpingoophorectomy Left 12/26/2014    Procedure: laporoscopic  partial left oophorectomy. drainage of right ovarian cyst;  Surgeon: Tereso NewcomerUgonna A Nioka Thorington, MD;  Location: [facility];  Service: Gynecology;  Laterality: Left;   The following portions of the patient's history were reviewed and updated as appropriate: allergies, current medications, past family history, past medical history, past social history, past surgical history and problem list.    Review of Systems:  Pertinent items noted in HPI and remainder of comprehensive ROS otherwise negative.  Objective:  Physical Exam BP 121/79 mmHg  Pulse 84  Temp(Src) 98.4 F (36.9 C)  Ht 4\' 10"  (1.473 m)  Wt 122 lb 8 oz (55.566 kg)  BMI 25.61 kg/m2  LMP 01/14/2015 CONSTITUTIONAL: Well-developed, well-nourished female in no acute distress.  HENT:  Normocephalic, atraumatic. External right and left ear normal. Oropharynx is clear and moist EYES: Conjunctivae and EOM are normal. Pupils are equal, round, and reactive to light. No scleral icterus.  NECK: Normal range of motion, supple, no masses SKIN: Skin is warm and dry. No rash noted. Not diaphoretic. No erythema. No pallor. NEUROLGIC: Alert and oriented to person, place, and time. Normal reflexes, muscle tone coordination. No cranial nerve deficit noted. PSYCHIATRIC: Normal mood and affect. Normal behavior. Normal judgment and thought content. CARDIOVASCULAR: Normal heart rate noted RESPIRATORY: Effort and breath sounds normal, no problems with respiration  noted ABDOMEN: Soft, no distention noted.  Laparoscopic Incisions are healing well, much improved compared to previous encounter. No erythema. Umbilical of incision treated with silver nitrate. PELVIC: Deferred  MUSCULOSKELETAL: Normal range of motion. No edema noted.   Assessment & Plan:  1. Postoperative wound infection, subsequent encounter Healed well - ibuprofen (ADVIL,MOTRIN) 800 MG tablet; Take 1 tablet (800 mg total) by mouth every 8 (eight) hours as needed for moderate pain.  Dispense: 60 tablet; Refill: 3  2. Anxiety Desires refill - hydrOXYzine (VISTARIL) 25 MG capsule; Take 1-2 capsules (25-50 mg total) by mouth every 6 (six) hours as needed for anxiety or itching.  Dispense: 60 capsule; Refill: 6 Routine preventative health maintenance measures emphasized. Please refer to After Visit Summary for other counseling recommendations.    [name]  Celvin Taney, MD, FACOG Attending Obstetrician & Gynecologist, [facility]

## 2015-01-30 NOTE — Progress Notes (Signed)
Would like a new medicine for the itching/nerves. C/o funny smell from navel area/ bleeding right lap incision last night.

## 2015-02-06 ENCOUNTER — Emergency Department (HOSPITAL_COMMUNITY)
Admission: EM | Admit: 2015-02-06 | Discharge: 2015-02-06 | Disposition: A | Discharge status: Home / Self Care | Payer: BLUE CROSS/BLUE SHIELD | Attending: Emergency Medicine | Admitting: Emergency Medicine

## 2015-02-06 ENCOUNTER — Encounter (HOSPITAL_COMMUNITY): Payer: Self-pay | Admitting: Emergency Medicine

## 2015-02-06 ENCOUNTER — Emergency Department (HOSPITAL_COMMUNITY): Payer: BLUE CROSS/BLUE SHIELD

## 2015-02-06 DIAGNOSIS — Z8742 Personal history of other diseases of the female genital tract: Secondary | ICD-10-CM | POA: Diagnosis not present

## 2015-02-06 DIAGNOSIS — Z8679 Personal history of other diseases of the circulatory system: Secondary | ICD-10-CM | POA: Diagnosis not present

## 2015-02-06 DIAGNOSIS — Z79899 Other long term (current) drug therapy: Secondary | ICD-10-CM | POA: Insufficient documentation

## 2015-02-06 DIAGNOSIS — R05 Cough: Secondary | ICD-10-CM | POA: Diagnosis present

## 2015-02-06 DIAGNOSIS — J45901 Unspecified asthma with (acute) exacerbation: Secondary | ICD-10-CM | POA: Insufficient documentation

## 2015-02-06 DIAGNOSIS — J40 Bronchitis, not specified as acute or chronic: Secondary | ICD-10-CM

## 2015-02-06 MED ORDER — ALBUTEROL SULFATE HFA 108 (90 BASE) MCG/ACT IN AERS: 2.0000 | INHALATION_SPRAY | Freq: Four times a day (QID) | RESPIRATORY_TRACT | Status: AC | PRN

## 2015-02-06 MED ORDER — AZITHROMYCIN 250 MG PO TABS
500.0000 mg | ORAL_TABLET | Freq: Once | ORAL | Status: AC
  Administered 2015-02-06: 500 mg via ORAL
  Filled 2015-02-06: qty 2

## 2015-02-06 MED ORDER — PREDNISONE 20 MG PO TABS: 40.0000 mg | ORAL_TABLET | Freq: Every day | ORAL | Status: AC

## 2015-02-06 MED ORDER — PREDNISONE 20 MG PO TABS
40.0000 mg | ORAL_TABLET | Freq: Once | ORAL | Status: AC
  Administered 2015-02-06: 40 mg via ORAL
  Filled 2015-02-06: qty 2

## 2015-02-06 MED ORDER — AZITHROMYCIN 250 MG PO TABS: ORAL_TABLET | ORAL | Status: AC

## 2015-02-06 MED ORDER — HYDROCODONE-HOMATROPINE 5-1.5 MG/5ML PO SYRP: 5.0000 mL | ORAL_SOLUTION | Freq: Four times a day (QID) | ORAL | Status: AC | PRN

## 2015-02-06 MED ORDER — ALBUTEROL SULFATE (2.5 MG/3ML) 0.083% IN NEBU: 2.5000 mg | INHALATION_SOLUTION | RESPIRATORY_TRACT | Status: AC | PRN

## 2015-02-06 MED ORDER — IPRATROPIUM-ALBUTEROL 0.5-2.5 (3) MG/3ML IN SOLN
3.0000 mL | RESPIRATORY_TRACT | Status: DC
  Administered 2015-02-06: 3 mL via RESPIRATORY_TRACT
  Filled 2015-02-06: qty 3

## 2015-02-06 NOTE — ED Provider Notes (Signed)
CSN: [id number]     Arrival date & time 02/06/15  1143 History   First MD Initiated Contact with Patient 02/06/15 1223     Chief Complaint  Patient presents with  . Cough  . Anxiety     (Consider location/radiation/quality/duration/timing/severity/associated sxs/prior Treatment) Patient is a 34 y.o. female presenting with cough and anxiety.  Cough Cough characteristics:  Productive Sputum characteristics:  Green and yellow Severity:  Mild Onset quality:  Gradual Duration:  2 days Timing:  Constant Progression:  Worsening Chronicity:  Recurrent Smoker: no   Context: not animal exposure and not fumes   Relieved by:  None tried Associated symptoms: shortness of breath   Associated symptoms: no chest pain, no chills, no eye discharge, no fever and no headaches   Anxiety Associated symptoms include shortness of breath. Pertinent negatives include no chest pain, no abdominal pain and no headaches.    Past Medical History  Diagnosis Date  . Asthma   . Migraines   . Ovarian cyst, right     2 surgeries  . Pneumothorax    Past Surgical History  Procedure Laterality Date  . Ovarian cyst surgery  2006 & 2009  . Salpingoophorectomy Left 12/26/2014    Procedure: laporoscopic  partial left oophorectomy. drainage of right ovarian cyst;  Surgeon: Tereso Newcomer, MD;  Location: [facility];  Service: Gynecology;  Laterality: Left;   Family History  Problem Relation Age of Onset  . Anesthesia problems Neg Hx   . Hypotension Neg Hx   . Malignant hyperthermia Neg Hx   . Pseudochol deficiency Neg Hx   . Hypertension Father   . Asthma Father   . Cancer Father     prostate  . Hypertension Mother   . Bronchitis Mother    Social History  Substance Use Topics  . Smoking status: Never Smoker   . Smokeless tobacco: Never Used  . Alcohol Use: Yes     Comment: occasionally    OB History    Gravida Para Term Preterm AB TAB SAB Ectopic Multiple Living       Review  of Systems  Constitutional: Negative for fever and chills.  HENT: Negative for congestion and facial swelling.   Eyes: Negative for discharge and redness.  Respiratory: Positive for cough and shortness of breath.   Cardiovascular: Negative for chest pain.  Gastrointestinal: Negative for abdominal pain and abdominal distention.  Endocrine: Negative for polydipsia.  Genitourinary: Negative for dysuria.  Musculoskeletal: Negative for back pain.  Skin: Negative for wound.  Neurological: Negative for headaches.  All other systems reviewed and are negative.     Allergies  Chocolate; Dermatological products, misc.; Dilaudid; and Morphine and related  Home Medications   Prior to Admission medications   Medication Sig Start Date End Date Taking? Authorizing Provider  guaifenesin (ROBITUSSIN) 100 MG/5ML syrup Take 200 mg by mouth daily as needed for cough.   Yes Historical Provider, MD  HYDROcodone-acetaminophen (NORCO/VICODIN) 5-325 MG tablet Take 1-2 tablets by mouth every 6 (six) hours as needed for moderate pain. 12/26/14  Yes Tereso Newcomer, MD  ibuprofen (ADVIL,MOTRIN) 800 MG tablet Take 1 tablet (800 mg total) by mouth every 8 (eight) hours as needed for moderate pain. 01/30/15  Yes Tereso Newcomer, MD  albuterol (PROVENTIL HFA;VENTOLIN HFA) 108 (90 BASE) MCG/ACT inhaler Inhale 2 puffs into the lungs every 6 (six) hours as needed for wheezing or shortness of breath. For shortness of  breath 02/06/15   Marily MemosJason Cathline Dowen, MD  albuterol (PROVENTIL) (2.5 MG/3ML) 0.083% nebulizer solution Take 3 mLs (2.5 mg total) by nebulization every 4 (four) hours as needed for wheezing or shortness of breath. 02/06/15   Marily MemosJason Kaylla Cobos, MD  azithromycin (ZITHROMAX) 250 MG tablet Take one daily starting 11/18 02/06/15   Marily MemosJason Adynn Caseres, MD  HYDROcodone-homatropine [facility](HYCODAN) 5-1.5 MG/5ML syrup Take 5 mLs by mouth every 6 (six) hours as needed for cough. 02/06/15   Marily MemosJason Jakki Doughty, MD  hydrOXYzine (VISTARIL) 25 MG capsule  Take 1-2 capsules (25-50 mg total) by mouth every 6 (six) hours as needed for anxiety or itching. 01/30/15   Tereso NewcomerUgonna A Anyanwu, MD  oxyCODONE-acetaminophen (PERCOCET/ROXICET) 5-325 MG tablet Take 1 tablet by mouth every 4 (four) hours as needed for severe pain. 01/05/15   Aviva SignsMarie L Williams, CNM  predniSONE (DELTASONE) 20 MG tablet Take 2 tablets (40 mg total) by mouth daily with breakfast. 02/07/15 02/11/15  Marily MemosJason Xyla Leisner, MD   BP 118/60 mmHg  Pulse 115  Temp(Src) 98.2 F (36.8 C) (Oral)  Resp 20  SpO2 100%  LMP 01/14/2015 Physical Exam  Constitutional: She appears well-developed and well-nourished.  HENT:  Head: Normocephalic and atraumatic.  Neck: Normal range of motion.  Cardiovascular: Normal rate and regular rhythm.   Pulmonary/Chest: Effort normal. No stridor. No respiratory distress. She has wheezes. She has no rales. She exhibits no tenderness.  Abdominal: She exhibits no distension.  Neurological: She is alert.  Nursing note and vitals reviewed.   ED Course  Procedures (including critical care time) Labs Review Labs Reviewed - No data to display  Imaging Review Dg Chest 2 View  02/06/2015  CLINICAL DATA:  Per EMS was hyperventilating on arrival. Clear lung sounds, felt some chest tightness to left side that has gone away in route. Pt states she may have an undiagnosed anxiety disorder. Pt complaining of a dry cough. EXAM: CHEST  2 VIEW COMPARISON:  02/26/2014 FINDINGS: The heart size and mediastinal contours are within normal limits. Both lungs are clear. The visualized skeletal structures are unremarkable. IMPRESSION: No active cardiopulmonary disease. Electronically Signed   By: Norva PavlovElizabeth  Brown M.D.   On: 02/06/2015 12:51   Dg Wrist Complete Left  02/06/2015  CLINICAL DATA:  Possible fracture, injury this morning EXAM: LEFT WRIST - COMPLETE 3+ VIEW COMPARISON:  None. FINDINGS: Four views of the left wrist submitted. No acute fracture or subluxation. No radiopaque foreign  body. IMPRESSION: Negative. Electronically Signed   By: Natasha MeadLiviu  Pop M.D.   On: 02/06/2015 13:48   I have personally reviewed and evaluated these images and lab results as part of my medical decision-making.   EKG Interpretation   Date/Time:  Thursday February 06 2015 12:04:35 EST Ventricular Rate:  84 PR Interval:  124 QRS Duration: 71 QT Interval:  367 QTC Calculation: 434 R Axis:   47 Text Interpretation:  Sinus rhythm ED PHYSICIAN INTERPRETATION AVAILABLE  IN [facility] Confirmed by TEST, Record ([id number]) on 02/07/2015 7:09:55  AM      MDM   Final diagnoses:  Bronchitis   Asthma exacerbation v bronchitis. Will tx here and start abx. Dc on same with steroid burst.     Marily MemosJason Candice Tobey, MD 02/08/15 [id number]

## 2015-02-06 NOTE — Discharge Instructions (Signed)
°Emergency Department Resource Guide °1) Find a Doctor and Pay Out of Pocket °Although you won't have to find out who is covered by your insurance plan, it is a good idea to ask around and get recommendations. You will then need to call the office and see if the doctor you have chosen will accept you as a new patient and what types of options they offer for patients who are self-pay. Some doctors offer discounts or will set up payment plans for their patients who do not have insurance, but you will need to ask so you aren't surprised when you get to your appointment. ° °2) Contact Your Local Health Department °Not all health departments have doctors that can see patients for sick visits, but many do, so it is worth a call to see if yours does. If you don't know where your local health department is, you can check in your phone book. The CDC also has a tool to help you locate your state's health department, and many state websites also have listings of all of their local health departments. ° °3) Find a Walk-in Clinic °If your illness is not likely to be very severe or complicated, you may want to try a walk in clinic. These are popping up all over the country in pharmacies, drugstores, and shopping centers. They're usually staffed by nurse practitioners or physician assistants that have been trained to treat common illnesses and complaints. They're usually fairly quick and inexpensive. However, if you have serious medical issues or chronic medical problems, these are probably not your best option. ° °No Primary Care Doctor: °- Call [facility] at  832-8000 - they can help you locate a primary care doctor that  accepts your insurance, provides certain services, etc. °- Physician Referral Service- 1-800-533-3463 ° °Chronic Pain Problems: °Organization         Address  Phone   Notes  °[facility]  (336) 297-2271 Patients need to be referred by their primary care doctor.  ° °Medication  Assistance: °Organization         Address  Phone   Notes  °[place] [facility] [address] °[place], [place] [address] (336) 641-8030 --Must be a resident of [place] °-- Must have NO insurance coverage whatsoever (no Medicaid/ Medicare, etc.) °-- The pt. MUST have a primary care doctor that directs their care regularly and follows them in the community °  °[facility]  (866) 331-1348   °[employer]  (888) 892-1162   ° °Agencies that provide inexpensive medical care: °Organization         Address  Phone   Notes  °[facility]  (336) 832-8035   °[facility]    (336) 832-7272   °[facility] [address] °[place], [place] [address] (336) 832-4777   °[facility] [address], °[place] (336) 271-4999   °[facility]    (336) 373-0678   °[facility]    (336) 272-1050   °[facility] ° [address], [place] Phone:  (336) 832-4444, Fax:  (336) 832-4440 Hours of Operation:  9 am - 6 pm, M-F.  Also accepts Medicaid/Medicare and self-pay.  °[facility] ° [address],  Phone: (336) 832-3150, Fax: (336) 832-3151. Hours of Operation:  8:30 am - 5:30 pm, M-F.  Also accepts Medicaid and self-pay.  °[facility] [place] [address], [place] Phone: (336) 878-6027   °[facility] [address], [place], [place] (336)723-1848, Ext. 123 Mondays & Thursdays: 7-9 AM.  First 15 patients are seen on a first come, first serve basis. °  ° °Medicaid-accepting [place] Providers: ° °Organization         Address  Phone   Notes  °[facility] [address], [place] (336) 641-2100 Also accepts self-pay patients.  °[facility] [address], [place] ° (336) 856-9996   °[facility] [address], [place]  (336) 288-8857   °[facility] [address] [place] [address], [place] (336) 299-7000   °Veita Bland [address], [place]  ° (336) 373-1557 Only accepts [place] Access Medicaid patients after they have their name applied to their card.  ° °Self-Pay (no insurance) in [place]: ° °Organization         Address  Phone   Notes  °Sickle Cell Patients, [facility] [address], [place] (336) 832-1970   °[facility] [address], [place] (336) 832-4400   °[facility] ° [address], [place] (336) 992-4800   °[facility]/Dr. Osei-Bonsu ° [address] [place] [address], [place] or [address], [place] (336) 841-8500 Phone number for both [place] and [place] locations is the same.  °[facility] [address], [place] (336) 299-0000   °[facility] [address] [place] [address], [place] or [address] (336) 852-7530 °(336) 878-2260   °[facility] [address], [place] (336) 350-1642, phone; (336) 294-5005, fax Sees patients 1st and 3rd Saturday of every month.  Must not qualify for public or private insurance (i.e. Medicaid, Medicare, [place] Health Choice, Veterans' Benefits) • Household income should be no more than 200% of the poverty level •The clinic cannot treat you if you are pregnant or think you are pregnant • Sexually transmitted diseases are not treated at the clinic.  ° ° °Dental Care: °Organization         Address  Phone  Notes  °[place] [facility] [address],  (336) 641-6152 Accepts children up to age 21 who are enrolled in Medicaid or [place] Health Choice; pregnant women with a Medicaid card; and children who have applied for Medicaid or [place] Health Choice, but were declined, whose parents can pay a reduced fee at time of service.  °[place]  [facility] [place]  [address], [place] (336) 641-7733 Accepts children up to age 21 who are enrolled in Medicaid or  Health Choice; pregnant women with a Medicaid card; and children who have applied for Medicaid or  Health Choice, but were declined, whose parents can pay a reduced fee at time of service.  °[facility] ° [address],  (336) 641-4533 Patients are seen by appointment only. Walk-ins are not accepted. [facility] will see patients 18 years of age and older. °Monday - Tuesday (8am-5pm) °Most Wednesdays (8:30-5pm) °$30 per visit, cash only  °[facility] ° [address], [place] (336) 641-4533 Patients are seen by appointment only. Walk-ins are not accepted. [facility] will see patients 18 years of age and older. °One   Wednesday Evening (Monthly: Volunteer Based).  $30 per visit, cash only  °[facility]  (919) 537-3737 for adults; Children under age 4, call Graduate Pediatric Dentistry at ([phone]) [phone]. Children aged 4-14, please call (919) [phone] to request a pediatric application. ° Dental services are provided in all areas of dental care including fillings, crowns and bridges, complete and partial dentures, implants, gum treatment, root canals, and extractions. Preventive care is also provided. Treatment is provided to both adults and children. °Patients are selected via a lottery and there is often a waiting list. °  °[facility] [address], °[place] ° (336) 763-8833 [email or web address] °  °[facility] [address], [place], [place] (336)723-1848, Ext. 123 Second and Fourth Thursday of each month, opens at 6:30 AM; Clinic ends at 9 AM.  Patients are seen on a first-come first-served basis, and a limited number are seen during each clinic.  ° °[facility] ° [address], [place], [place] (336) 723-7904    Eligibility Requirements °You must have lived in [place], [place], or [place] counties for at least the last three months. °  You cannot be eligible for state or federal sponsored healthcare insurance, including Veterans Administration, Medicaid, or Medicare. °  You generally cannot be eligible for healthcare insurance through your employer.  °  How to apply: °Eligibility screenings are held every Tuesday and Wednesday afternoon from 1:00 pm until 4:00 pm. You do not need an appointment for the interview!  °[facility] [address], [place], [place] 336-631-2330   °[facility]  336-342-8273   °[facility]  336-703-3100   °[facility]  336-570-6415   ° °Behavioral Health Resources in the Community: °Intensive Outpatient Programs °Organization         Address  Phone  Notes  °[facility] [address], [place], [place] 336-878-6098   °[facility] [address], [place], [place] 336-832-9800   °[facility] [address], [place], [place] ° 336-882-2125   °[facility] [address],  °[place], [place] 1-800-853-5163 or 336-641-4981   °Substance Abuse Resources °Organization         Address  Phone  Notes  °[facility]  336-882-2125   °[facility]  336-784-9470   °[facility]  336-285-9073   °[facility]  336-845-3988   °Residential & Outpatient Substance Abuse Program  1-800-659-3381   °Psychological Services °Organization         Address  Phone  Notes  °[facility]  336- 832-9600   °[facility]  336- 378-7881   °[facility] [address], [place] 1-800-853-5163 or 336-641-4981   ° °Mobile Crisis Teams °Organization         Address  Phone  Notes  °[facility], Mobile Crisis Care Unit  1-877-626-1772   °Assertive °[facility] ° [address]  [place], [place] 336-834-9664   °Sharon DeEsch [address] °[place] 336-554-5454   ° °Self-Help/Support Groups °Organization         Address  Phone             Notes  °[facility] [place] [facility] - variety of support groups  336- 373-1402 Call for more information  °Narcotics Anonymous (NA), [facility] [address], °[place]  2 meetings at this location  ° °  Residential Treatment Programs °Organization         Address  Phone  Notes  °[facility] [address],    °[place]  1-866-801-8205   °[facility] ° [address], [place], [place] 704-293-8524   °[facility] [address], [place] 336-845-3988 Admissions: 8am-3pm M-F  °[facility] [address]    °[place], [place] 336-841-1104   °[facility] [address], [place], [place] 336-379-7146   °[facility] [address]  °[place], [place] 336-285-9073   °[facility] Intensive Outpatient [address], [place], [place] 336-852-3033   °[facility] [address]  °[place], [place] 1-877-615-2722 or 336-784-9470   °[facility] [address] [place], [place] 336-227-7417 Accepts Medicaid  °[facility] [address]  °[place] 1-800-659-3381 Substance Abuse/Addiction Treatment  ° °[place] Behavioral Health Resources °Organization         Address  Phone  Notes  °[facility]  (888) 581-9988   °Julie Brannon, PhD [address] [place], [place]   (336) 349-5553 or (336) 951-0000   °[facility]   [address] °[place], [place] (336) 349-4454   °[facility] [address], [place], [place] (336) 342-8316 Insurance/Medicaid/sponsorship through [facility]  °[facility] [address]                                    [place], [place] (336) 342-8316 Therapy/tele-psych/case    °[facility] [address]  ° [place], [place] (336) 349-2233    °Dr. Arfeen  (336) 349-4544   °[facility] [place]  [employer] [place] [facility] 1) [address], [place] °2) [address], [place] °3)  [address], [place] (336) 349-3220 °(336) 342-7768 ° °(336) 342-8140   °[place] [facility] (336) 342-1394 or (336) 342-3537 (After Hours)    ° ° °

## 2015-02-06 NOTE — ED Notes (Addendum)
Per EMS was hyperventilating on arrival. Clear lung sounds, felt some chest tightness to left side that has gone away in route. Pt states she may have an undiagnosed anxiety disorder. Pt complaining of a dry cough that "feels like I need to cough something up."  Lungs clear, in no obvious distress, also complaining of left wrist pain without injury.

## 2015-02-07 NOTE — ED Notes (Signed)
Due to the Pt working at a [employer] and continuing to have a cough, this Consulting civil engineerCharge RN modified the work note to include today and returning to work [employer] account executivetomorrow.  It was explained to the Pt that this was an exception.

## 2015-02-07 NOTE — ED Notes (Signed)
Pt's chart accessed by this Charge RN, due to the Pt needing a work note.  This Consulting civil engineerCharge RN reprinted the note that she was given yesterday.

## 2017-03-03 IMAGING — US US TRANSVAGINAL NON-OB
1 series · 15 of 25 positions shown · non-contrast
Comparison: Four days ago

CLINICAL DATA: Left ovarian cyst, rule out torsion or rupture.

EXAM:
TRANSVAGINAL ULTRASOUND OF PELVIS
DOPPLER ULTRASOUND OF OVARIES
TECHNIQUE: Transvaginal ultrasound examination of the pelvis was performed
including evaluation of the uterus, ovaries, adnexal regions, and
pelvic cul-de-sac.
Color and duplex Doppler ultrasound was utilized to evaluate blood
flow to the ovaries.

[Series 1: us transvaginal non-ob · 15 of 57 slices shown]
[im 1/57]
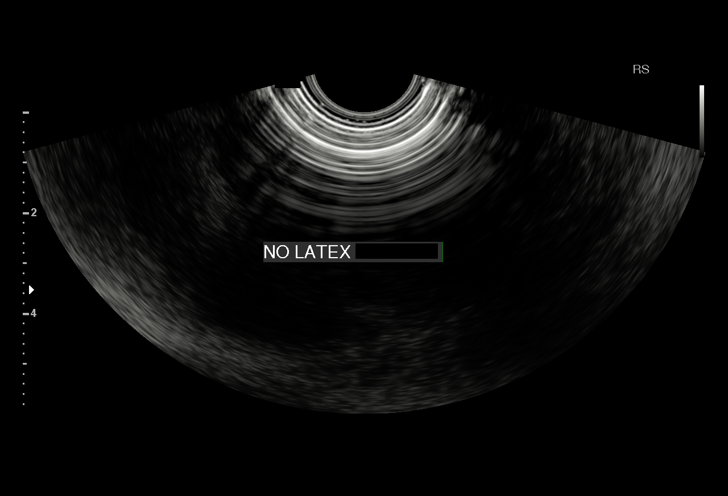
[im 5/57]
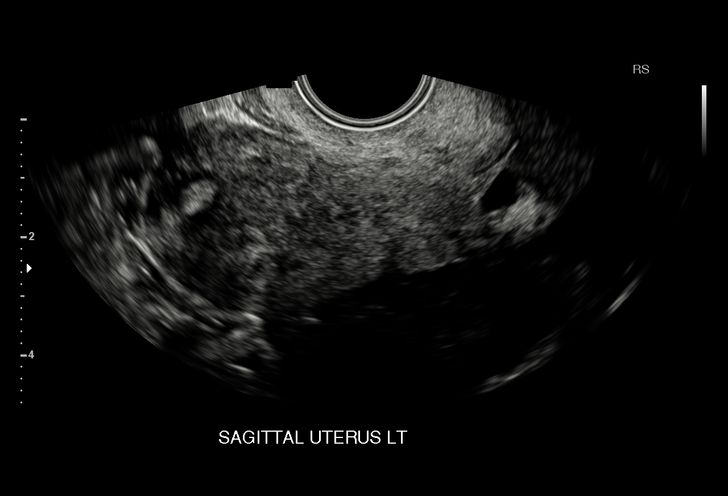
[im 10/57]
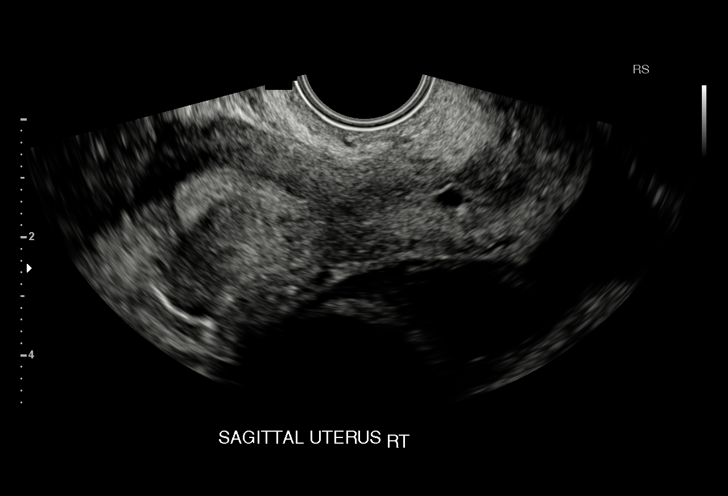
[im 12/57]
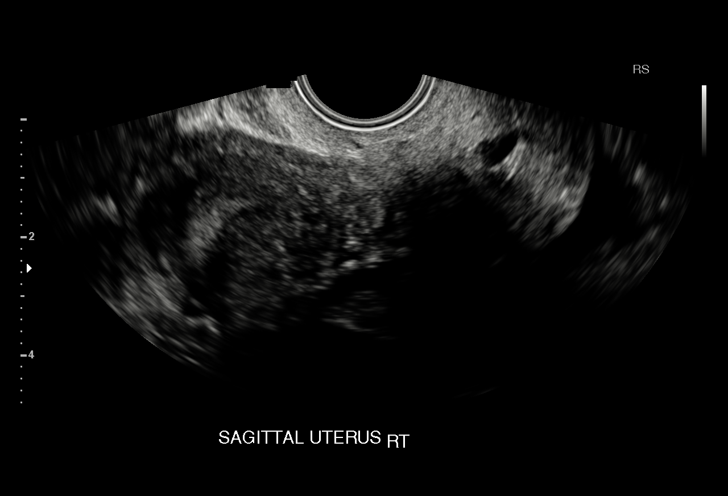
[im 17/57]
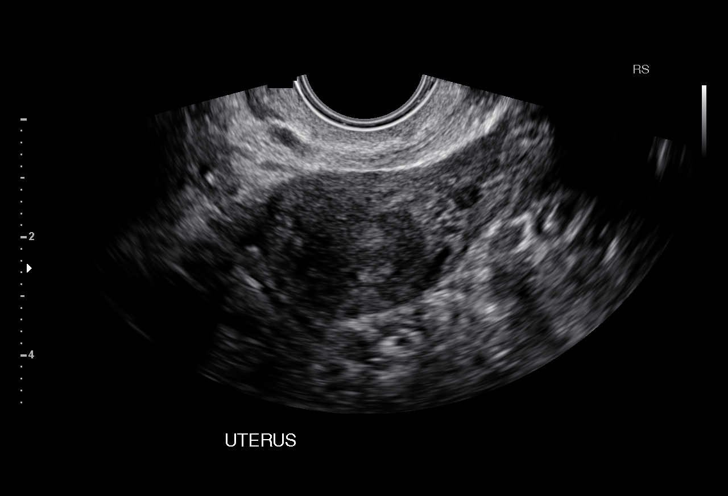
[im 22/57]
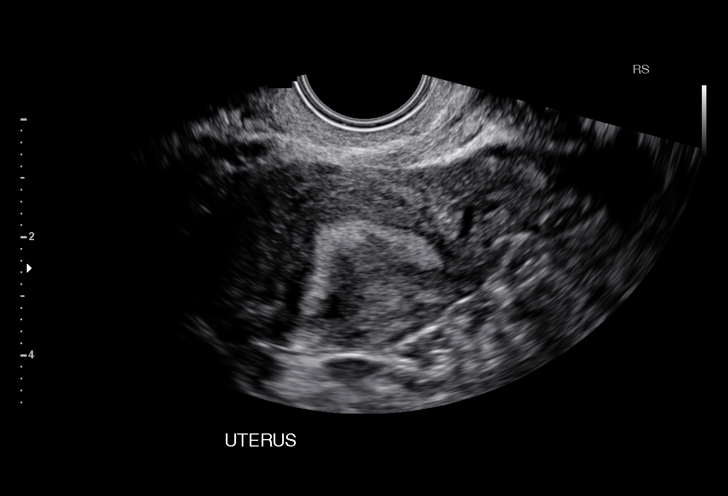
[im 24/57]
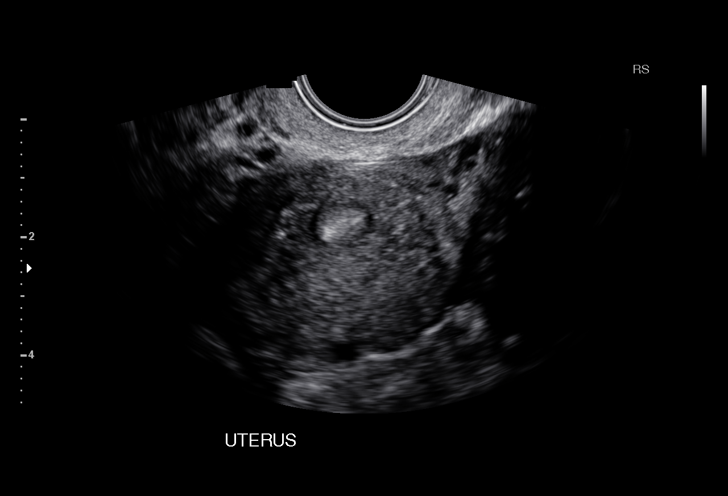
[im 29/57]
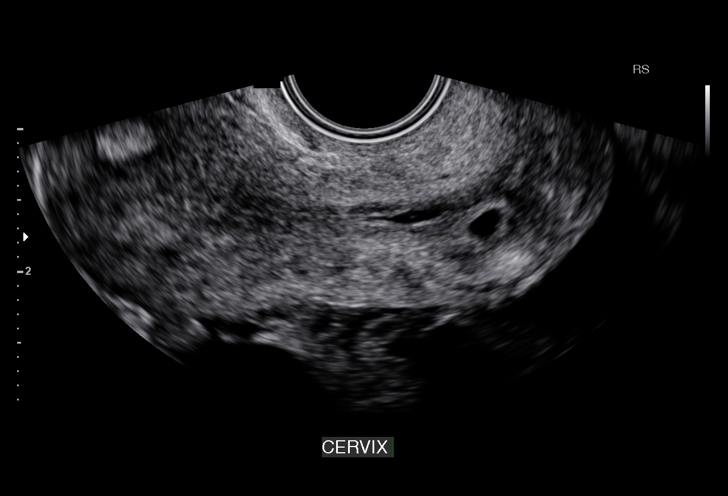
[im 33/57]
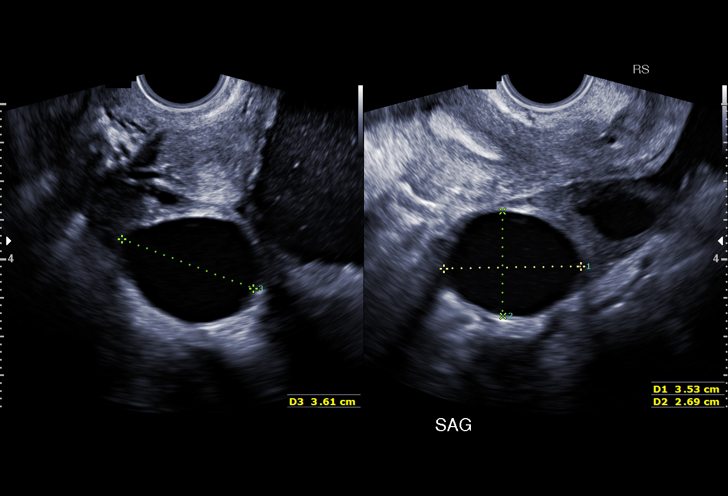
[im 36/57]
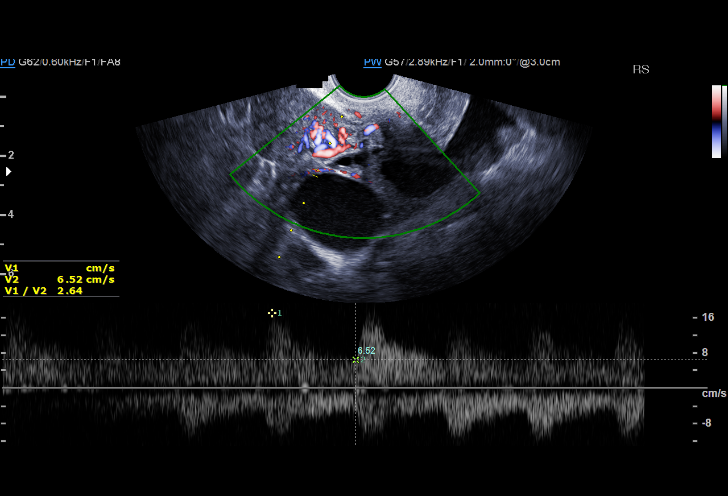
[im 40/57]
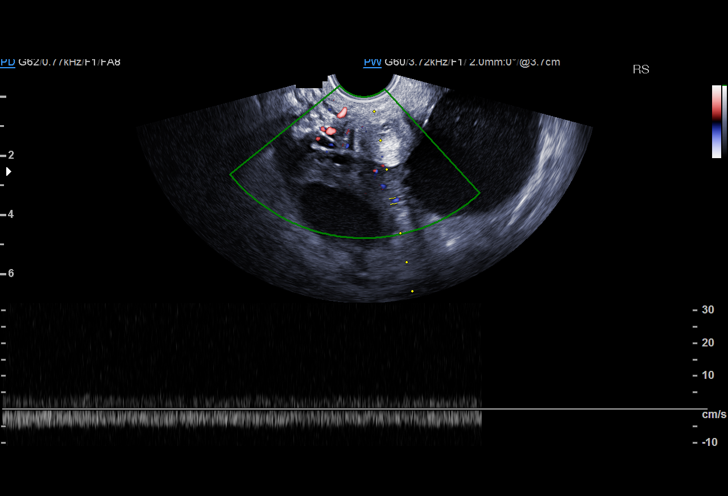
[im 45/57]
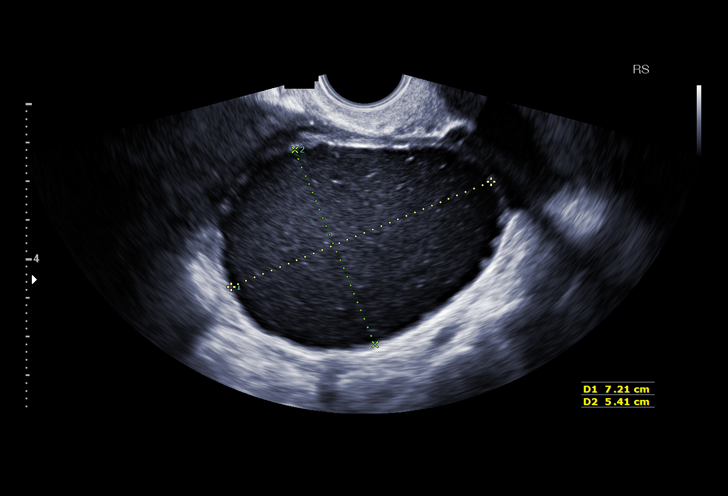
[im 47/57]
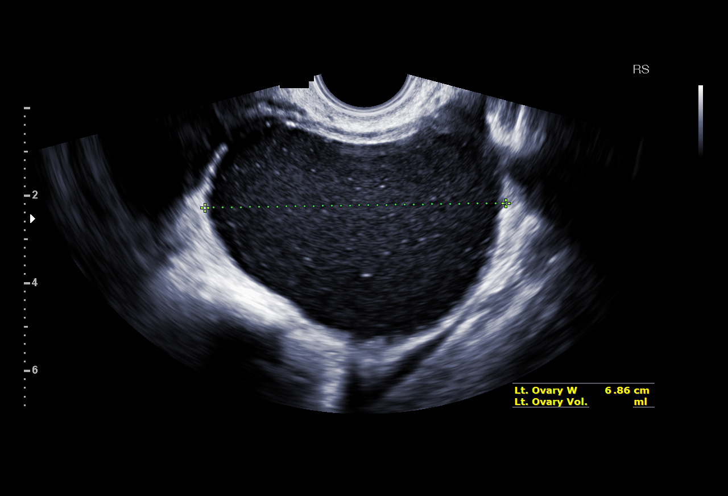
[im 52/57]
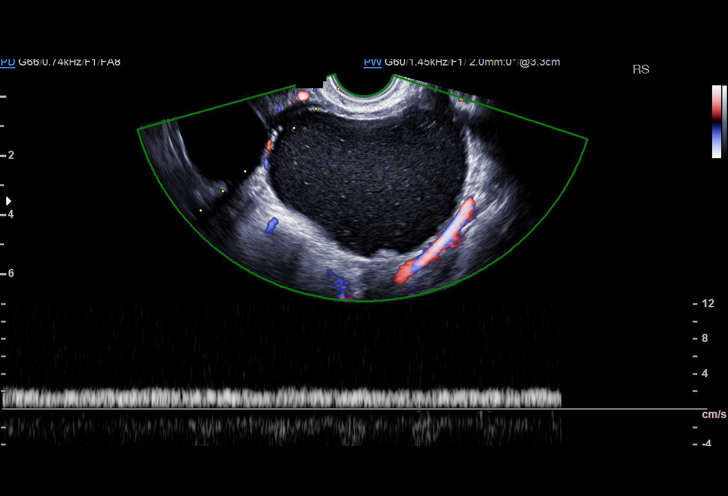
[im 57/57]
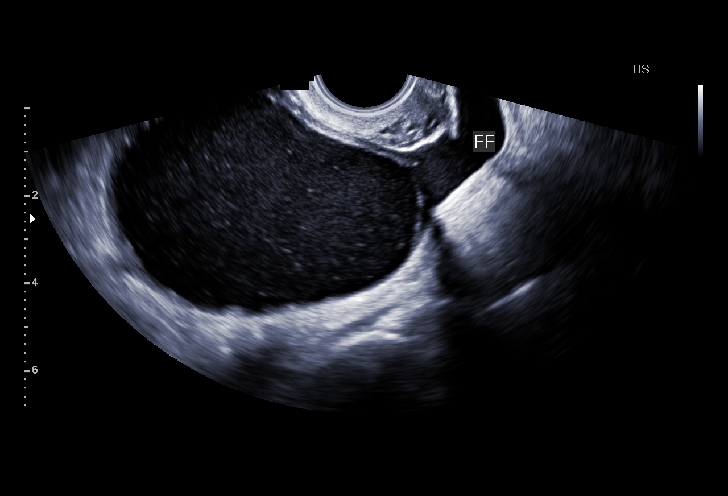

[15 of 25 positions shown; findings below may reference images not displayed]

FINDINGS: Uterus

Measurements: 8 x 3 x 4 cm. No fibroids or other mass visualized.

Endometrium

Thickness: 7 mm. The fundic portion of the endometrial cavity is
anteriorly displaced, but there is no underlying mass/ fibroid.

Right ovary

Measurements: 57 x 35 x 42 mm. A simple 36 mm cyst is noted, likely
follicular.

Left ovary

Measurements: 77 x 60 x 69 mm. Re- identified complex cystic mass
measuring 72 x 54 x 66 mm, likely stable when allowing for
differences in measurement. Internal mid level echoes and peripheral
nodularity present. Only some of the peripheral nodular are
echogenic. The largest nodule is 5 mm.

Pulsed Doppler evaluation demonstrates normal low-resistance
arterial and venous waveforms in both ovaries.

Small free fluid in the left pelvis.
IMPRESSION: 1. No acute finding.  Normal ovarian blood flow.
2. 72 mm complex left ovarian cyst with mural nodules. Endometrioma
or epithelial neoplasm could have this appearance.
3. Simple right ovarian cyst measuring 36 mm.
4. Small simple pelvic fluid.
